# Patient Record
Sex: Female | Born: 1985 | Race: White | Hispanic: No | Marital: Single | Smoking: Never smoker
Health system: Southern US, Community
[De-identification: ages and names within clinical notes are randomized; demographics above are authoritative.]

---

## 2005-01-22 ENCOUNTER — Ambulatory Visit: Payer: Self-pay | Admitting: Psychiatry

## 2005-01-22 ENCOUNTER — Inpatient Hospital Stay (HOSPITAL_COMMUNITY): Admission: EM | Admit: 2005-01-22 | Discharge: 2005-01-24 | Payer: Self-pay | Admitting: Psychiatry

## 2009-07-16 ENCOUNTER — Ambulatory Visit (HOSPITAL_COMMUNITY): Admission: RE | Admit: 2009-07-16 | Discharge: 2009-07-16 | Payer: Self-pay | Admitting: Pediatrics

## 2009-07-22 ENCOUNTER — Ambulatory Visit (HOSPITAL_COMMUNITY): Admission: RE | Admit: 2009-07-22 | Discharge: 2009-07-22 | Payer: Self-pay | Admitting: General Surgery

## 2009-08-04 ENCOUNTER — Encounter (HOSPITAL_COMMUNITY): Admission: RE | Admit: 2009-08-04 | Discharge: 2009-09-03 | Payer: Self-pay | Admitting: Oncology

## 2009-08-05 ENCOUNTER — Ambulatory Visit (HOSPITAL_COMMUNITY): Payer: Self-pay | Admitting: Oncology

## 2009-09-16 ENCOUNTER — Encounter (HOSPITAL_COMMUNITY): Admission: RE | Admit: 2009-09-16 | Discharge: 2009-10-16 | Payer: Self-pay | Admitting: Oncology

## 2009-10-05 ENCOUNTER — Ambulatory Visit (HOSPITAL_COMMUNITY): Payer: Self-pay | Admitting: Oncology

## 2010-04-07 ENCOUNTER — Encounter (HOSPITAL_COMMUNITY): Admission: RE | Admit: 2010-04-07 | Payer: Self-pay | Source: Home / Self Care | Admitting: Oncology

## 2010-04-07 ENCOUNTER — Ambulatory Visit (HOSPITAL_COMMUNITY): Payer: Self-pay | Admitting: Oncology

## 2010-06-28 LAB — IGG: IgG (Immunoglobin G), Serum: 1070 mg/dL (ref 694–1618)

## 2010-06-29 LAB — COMPREHENSIVE METABOLIC PANEL
ALT: 35 U/L (ref 0–35)
AST: 33 U/L (ref 0–37)
Alkaline Phosphatase: 82 U/L (ref 39–117)
BUN: 7 mg/dL (ref 6–23)
Chloride: 105 mEq/L (ref 96–112)
Glucose, Bld: 94 mg/dL (ref 70–99)
Sodium: 136 mEq/L (ref 135–145)
Total Protein: 6.5 g/dL (ref 6.0–8.3)

## 2010-06-29 LAB — DIFFERENTIAL
Basophils Relative: 0 % (ref 0–1)
Eosinophils Absolute: 0.2 10*3/uL (ref 0.0–0.7)
Lymphocytes Relative: 53 % — ABNORMAL HIGH (ref 12–46)
Monocytes Absolute: 0.3 10*3/uL (ref 0.1–1.0)
Monocytes Relative: 8 % (ref 3–12)

## 2010-06-29 LAB — CBC
HCT: 37 % (ref 36.0–46.0)
Hemoglobin: 13.1 g/dL (ref 12.0–15.0)
MCHC: 35.5 g/dL (ref 30.0–36.0)
RBC: 4.2 MIL/uL (ref 3.87–5.11)
WBC: 4 10*3/uL (ref 4.0–10.5)

## 2010-06-29 LAB — CYTOMEGALOVIRUS ANTIBODY, IGG: Cytomegalovirus(CMV) Antibody, IgG: NEGATIVE

## 2010-06-29 LAB — EPSTEIN-BARR VIRUS VCA ANTIBODY PANEL: EBV EA IgG: 0.68 {ISR}

## 2010-06-30 LAB — BASIC METABOLIC PANEL
CO2: 26 mEq/L (ref 19–32)
Creatinine, Ser: 0.73 mg/dL (ref 0.4–1.2)
Glucose, Bld: 99 mg/dL (ref 70–99)
Sodium: 138 mEq/L (ref 135–145)

## 2010-06-30 LAB — DIFFERENTIAL
Lymphocytes Relative: 43 % (ref 12–46)
Lymphs Abs: 1.7 10*3/uL (ref 0.7–4.0)
Monocytes Relative: 5 % (ref 3–12)
Neutro Abs: 1.9 10*3/uL (ref 1.7–7.7)
Neutrophils Relative %: 48 % (ref 43–77)

## 2010-06-30 LAB — CBC
HCT: 36 % (ref 36.0–46.0)
Hemoglobin: 12.6 g/dL (ref 12.0–15.0)
MCV: 88.9 fL (ref 78.0–100.0)
Platelets: 206 10*3/uL (ref 150–400)
RBC: 4.06 MIL/uL (ref 3.87–5.11)
RDW: 13 % (ref 11.5–15.5)

## 2010-08-27 NOTE — H&P (Signed)
NAME:  Angela Mathews, Angela Mathews             ACCOUNT NO.:  [id number]   MEDICAL RECORD NO.:  [id number]          PATIENT TYPE:  IPS   LOCATION:  0303                          FACILITY:  [facility]   PHYSICIAN:  Jeanice Lim, M.D. DATE OF BIRTH:  Oct 28, 1985   DATE OF ADMISSION:  01/22/2005  DATE OF DISCHARGE:                         PSYCHIATRIC ADMISSION ASSESSMENT   This is a voluntary admission to the services of Dr. Aleatha Borer.   IDENTIFYING INFORMATION:  This is a 25 year old single white female.  She  presented to the ED at [facility] last night reporting suicidal  ideation.  She did not have a distinct plan.  She reported that she has been  eating 15-18 1-mg Xanax that she buys off the street on a daily basis.  She has been having increasing depression since the breakup with her partner  of 2 years a few months ago.  Apparently at that time she made a suicidal  gesture by superficially cutting her left wrist.  It did not require  stitches.  Besides the Xanax every day, she has also been drinking a pint of  liquor daily or when drinking beer drinking at least a six-pack or sometimes  more.  She reports low self-esteem and that life is just not worth living.  She received a DWI last week and lost her chance of being in [facility] where she was in criminal justice.  She appeared depressed  with a flat affect.   PAST PSYCHIATRIC HISTORY:  She states that back in May and June she took  Lexapro which was prescribed by her primary care Stanley Lyness.   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  She is currently  enrolled at [facility] in criminal justice, but she states  she blew a 0.13 and got a DWI last week.  She does have a pending court date  on February 14, 2005.   FAMILY HISTORY:  Alcoholism on her mother's side, and her great grandfather  died from alcoholism.   ALCOHOL AND DRUG HISTORY:  As already noted.  She began using  Xanax in the  12th grade.  She has been increasing, her pattern which used to be 7-10 mg a  day.  She is now up to 16-18.  Alcohol and beer she began using at age 55.  She drinks a six-pack a day and has liquor 3 times a week, 16-ounce malt  liquor.   PRIMARY CARE Vaudine Dutan:  Dr. Neita Carp in [place].   MEDICAL PROBLEMS:  Morbid obesity.   MEDICATIONS:  She is not prescribed any.  She does however buy Xanax off the  street.  The blue 1-mg pills, she states.   ALLERGIES:  She has no known drug allergies.   PHYSICAL EXAMINATION:  She is 66 inches tall, weight 253 pounds, temperature  97.8, blood pressure 122/72 to 116/74, pulse 100-108, respirations 16.  She  has a total of 19 tattoos.  Other than that her physical exam was  unremarkable and is documented from the ER.   MENTAL STATUS EXAM:  She is alert and  oriented.  She has fair eye contact.  Her motor behavior is normal.  Her speech is normal.  She is alert.  She is  tearful and irritable.  She is missing the Xanax.  Her affect is  appropriate.  Her anxiety level is moderate.  Her thought processes are  coherent.  Her judgment is fair.  Her orientation is full.  Memory is  intact.  Her IQ is at least average.  Her insight was good.  Impulse control  was fair.  Appetite was fair.  Her need for sleep has been increased.  Yesterday she states she in fact felt suicidal and homicidal.  She was  afraid of what might happen, hence, she went to the emergency room.  She  denies ever having auditory or visual hallucinations.   ADMISSION DIAGNOSIS:  AXIS I:  Major depressive disorder.  Polysubstance  dependence.  AXIS II:  Deferred.  AXIS III:  Morbid obesity.  AXIS IV:  Family and relationship issues.  AXIS V:  35.   PLAN:  Admit for safety and stabilization.  Help support her though her  benzo withdrawal and alcohol withdrawal, although yesterday her alcohol  level was low.  Toward that end we will start the low dose Librium protocol  and  will also start her on some Wellbutrin.      Mickie Leonarda Salon, P.A.-C.      Jeanice Lim, M.D.  Electronically Signed    MD/MEDQ  D:  01/22/2005  T:  01/22/2005  Job:  [id number]

## 2010-08-27 NOTE — Discharge Summary (Signed)
NAME:  Angela Mathews, [place]             ACCOUNT NO.:  [id number]   MEDICAL RECORD NO.:  [id number]          PATIENT TYPE:  IPS   LOCATION:  0303                          FACILITY:  [facility]   PHYSICIAN:  Jeanice Lim, M.D. DATE OF BIRTH:  06-22-1985   DATE OF ADMISSION:  01/22/2005  DATE OF DISCHARGE:  01/24/2005                                 DISCHARGE SUMMARY   IDENTIFYING DATA:  This is a 25 year old single Caucasian female afraid she  would hurt herself of others.  She went to the emergency room.  In a same-  sex relationship for 2 years and broke a few months ago with increased  depression.  She buys Xanax from people off the street.  Taking up to 15-18  mg a day.   PAST PSYCHIATRIC HISTORY:  She denies past psychiatric history.  Took  Lexapro May through June.   MEDICATIONS:  Xanax, bought illegally off the street, taking 15-18 mg on  average.   ALLERGIES:  No known drug allergies.   PHYSICAL EXAMINATION:  Physical and neurologic exams essentially within  normal limits except for morbid obesity, and slight elevation in glucose as  far as routine admission labs.   MENTAL STATUS EXAM:  Alert and oriented x 3.  Dressed casually.  No  psychomotor abnormalities.  Speech within normal limits.  Mood depressed.  Affect restricted but anxious and tremulous.  Thought processes goal  directed.  Cognitively intact.  Judgment and insight were impaired.   ADMISSION DIAGNOSES:  AXIS I:  Benzodiazepine dependence.  Polysubstance  abuse.  Major depressive disorder, recurrent, moderate.  Rule out substance-  induced mood disorder.  Partial withdrawal from benzodiazepines, withdrawal  syndrome.  AXIS II:  Dependent personality traits.  AXIS III:  Morbid obesity.  AXIS IV:  Severe stressors with primary support group, social environment,  education, occupation, housing, economic and problems related to the legal  system.  AXIS V:  35/65.   HOSPITAL COURSE:  Patient was admitted, ordered  routine p.r.n. medications,  underwent further monitoring, was encouraged to participate in individual,  group and milieu therapy.  Patient was started on Librium, and Wellbutrin  was held initially to be started after patient was initially detoxed.  Patient was then monitored closely.  She had a DWI a few weeks ago.  She was  agreeable to intensive outpatient treatment.  Described a history of mood  swings with severe anxiety and stabilized on Depakote, Risperdal and  medication to restore sleep.  Patient was discharged with stable mood, no  dangerous ideation, no psychotic symptoms, no acute withdrawal symptoms,  agreeable to IOP.   DISCHARGE MEDICATIONS:  She was given medication education and discharged  on:  1.  Librium 25 mg 1 in the morning and 1 at 6:00 p.m. for 1 week and then 1      at 6:00 p.m. for 1 week and then stop due to her excessively high      prolonged Xanax use risk of prolonged withdrawal and serious      complications and indication to use a prolonged Librium taper.  2.  Wellbutrin  XL 150 mg q.a.m.  3.  Risperdal 0.5 mg at 8:00 p.m.  4.  Ambien 10 mg q.h.s. p.r.n.  5.  Depakote 500 mg at 8:00 p.m.   FOLLOW UP:  Dr. Ellery Plunk in [facility] on Friday, January 28, 2005 at 5:00 p.m.   DISCHARGE DIAGNOSES:  AXIS I:  Benzodiazepine dependence.  Polysubstance  abuse.  Major depressive disorder, recurrent, moderate.  Rule out substance-  induced mood disorder.  Partial withdrawal from benzodiazepines, withdrawal  syndrome.  AXIS II:  Dependent personality traits.  AXIS III:  Morbid obesity.  AXIS IV:  Severe stressors with primary support group, social environment,  education, occupation, housing, economic and problems related to the legal  system.  AXIS V:  Global assessment of function on discharge 55.      Jeanice Lim, M.D.  Electronically Signed     JEM/MEDQ  D:  02/15/2005  T:  02/16/2005  Job:  [id number]

## 2017-10-30 ENCOUNTER — Encounter (HOSPITAL_COMMUNITY): Payer: Self-pay

## 2017-10-30 ENCOUNTER — Other Ambulatory Visit: Payer: Self-pay

## 2017-10-30 ENCOUNTER — Emergency Department (HOSPITAL_COMMUNITY)
Admission: EM | Admit: 2017-10-30 | Discharge: 2017-10-30 | Disposition: A | Discharge status: Home / Self Care | Payer: Self-pay | Attending: Emergency Medicine | Admitting: Emergency Medicine

## 2017-10-30 ENCOUNTER — Emergency Department (HOSPITAL_COMMUNITY): Payer: Self-pay

## 2017-10-30 DIAGNOSIS — E876 Hypokalemia: Secondary | ICD-10-CM | POA: Insufficient documentation

## 2017-10-30 DIAGNOSIS — N201 Calculus of ureter: Secondary | ICD-10-CM | POA: Insufficient documentation

## 2017-10-30 LAB — COMPREHENSIVE METABOLIC PANEL
ALT: 14 U/L (ref 0–44)
AST: 16 U/L (ref 15–41)
Albumin: 3.9 g/dL (ref 3.5–5.0)
Alkaline Phosphatase: 56 U/L (ref 38–126)
Anion gap: 8 (ref 5–15)
BUN: 8 mg/dL (ref 6–20)
CHLORIDE: 106 mmol/L (ref 98–111)
CO2: 26 mmol/L (ref 22–32)
CREATININE: 0.75 mg/dL (ref 0.44–1.00)
Calcium: 9.4 mg/dL (ref 8.9–10.3)
GFR calc non Af Amer: >60 mL/min (ref >60)
Glucose, Bld: 122 mg/dL — ABNORMAL HIGH (ref 70–99)
POTASSIUM: 2.9 mmol/L — AB (ref 3.5–5.1)
SODIUM: 140 mmol/L (ref 135–145)
Total Bilirubin: 0.7 mg/dL (ref 0.3–1.2)
Total Protein: 7.1 g/dL (ref 6.5–8.1)

## 2017-10-30 LAB — CBC WITH DIFFERENTIAL/PLATELET
BASOS ABS: 0 10*3/uL (ref 0.0–0.1)
Basophils Relative: 1 %
EOS PCT: 1 %
Eosinophils Absolute: 0.1 10*3/uL (ref 0.0–0.7)
HEMATOCRIT: 38.8 % (ref 36.0–46.0)
HEMOGLOBIN: 12.6 g/dL (ref 12.0–15.0)
LYMPHS ABS: 1.5 10*3/uL (ref 0.7–4.0)
LYMPHS PCT: 30 %
MCH: 30 pg (ref 26.0–34.0)
MCHC: 32.5 g/dL (ref 30.0–36.0)
MCV: 92.4 fL (ref 78.0–100.0)
Monocytes Absolute: 0.5 10*3/uL (ref 0.1–1.0)
Monocytes Relative: 9 %
NEUTROS ABS: 3 10*3/uL (ref 1.7–7.7)
NEUTROS PCT: 59 %
Platelets: 284 10*3/uL (ref 150–400)
RBC: 4.2 MIL/uL (ref 3.87–5.11)
RDW: 13.4 % (ref 11.5–15.5)
WBC: 5.1 10*3/uL (ref 4.0–10.5)

## 2017-10-30 LAB — URINALYSIS, ROUTINE W REFLEX MICROSCOPIC
Glucose, UA: NEGATIVE mg/dL
LEUKOCYTES UA: NEGATIVE
Nitrite: NEGATIVE
PH: 6 (ref 5.0–8.0)
Protein, ur: 30 mg/dL — AB

## 2017-10-30 LAB — URINALYSIS, MICROSCOPIC (REFLEX)
RBC / HPF: >50 RBC/hpf (ref 0–5)
WBC, UA: NONE SEEN WBC/hpf (ref 0–5)

## 2017-10-30 LAB — LIPASE, BLOOD: Lipase: 27 U/L (ref 11–51)

## 2017-10-30 LAB — I-STAT BETA HCG BLOOD, ED (MC, WL, AP ONLY): I-stat hCG, quantitative: <5 m[IU]/mL (ref <5)

## 2017-10-30 MED ORDER — ONDANSETRON HCL 4 MG/2ML IJ SOLN
4.0000 mg | Freq: Once | INTRAMUSCULAR | Status: AC
  Administered 2017-10-30: 4 mg via INTRAVENOUS
  Filled 2017-10-30: qty 2

## 2017-10-30 MED ORDER — POTASSIUM CHLORIDE CRYS ER 20 MEQ PO TBCR
40.0000 meq | EXTENDED_RELEASE_TABLET | Freq: Once | ORAL | Status: AC
  Administered 2017-10-30: 40 meq via ORAL
  Filled 2017-10-30: qty 2

## 2017-10-30 MED ORDER — HYDROCODONE-ACETAMINOPHEN 5-325 MG PO TABS: 1.0000 | ORAL_TABLET | ORAL | 0 refills | Status: DC | PRN

## 2017-10-30 MED ORDER — HYDROMORPHONE HCL 1 MG/ML IJ SOLN
1.0000 mg | Freq: Once | INTRAMUSCULAR | Status: AC
  Administered 2017-10-30: 1 mg via INTRAVENOUS
  Filled 2017-10-30: qty 1

## 2017-10-30 MED ORDER — ONDANSETRON 4 MG PO TBDP: 4.0000 mg | ORAL_TABLET | Freq: Three times a day (TID) | ORAL | 0 refills | Status: DC | PRN

## 2017-10-30 MED ORDER — SODIUM CHLORIDE 0.9 % IV BOLUS
1000.0000 mL | Freq: Once | INTRAVENOUS | Status: AC
  Administered 2017-10-30: 1000 mL via INTRAVENOUS

## 2017-10-30 MED ORDER — FENTANYL CITRATE (PF) 100 MCG/2ML IJ SOLN
100.0000 ug | Freq: Once | INTRAMUSCULAR | Status: AC
  Administered 2017-10-30: 100 ug via INTRAVENOUS
  Filled 2017-10-30: qty 2

## 2017-10-30 MED ORDER — POTASSIUM CHLORIDE CRYS ER 20 MEQ PO TBCR: 20.0000 meq | EXTENDED_RELEASE_TABLET | Freq: Two times a day (BID) | ORAL | 0 refills | Status: DC

## 2017-10-30 MED ORDER — NAPROXEN 375 MG PO TABS: 375.0000 mg | ORAL_TABLET | Freq: Two times a day (BID) | ORAL | 0 refills | Status: DC

## 2017-10-30 MED ORDER — HYDROCODONE-ACETAMINOPHEN 5-325 MG PO TABS
1.0000 | ORAL_TABLET | Freq: Once | ORAL | Status: AC
  Administered 2017-10-30: 1 via ORAL
  Filled 2017-10-30: qty 1

## 2017-10-30 NOTE — ED Provider Notes (Signed)
[facility] EMERGENCY DEPARTMENT Provider Note   CSN: [id number] Arrival date & time: 10/30/17  8295     History   Chief Complaint Chief Complaint  Patient presents with  . Flank Pain    right    HPI Angela Mathews is a 32 y.o. female.  HPI  32 year old female presents with severe back and abdominal pain.  Started 2 hours ago and woke her up from sleep.  Never had similar symptoms.  She is laying on her right side as this helps ease the pain a little bit.  No prior history of kidney stones.  EMS gave her 30 mg IV Toradol and 4 mg Zofran.  Patient states the pain has not relieved at all.  It is severe.  Hard to describe but just feels horrible.  Has had nausea and has vomited once.  She feels an urgency to urinate but only a couple drops came out when she tried and there was a little bit of blood.  No fevers.  History reviewed. No pertinent past medical history.  There are no active problems to display for this patient.   History reviewed. No pertinent surgical history.   OB History   None      Home Medications    Prior to Admission medications   Medication Sig Start Date End Date Taking? Authorizing Provider  HYDROcodone-acetaminophen (NORCO) 5-325 MG tablet Take 1-2 tablets by mouth every 4 (four) hours as needed for severe pain. 10/30/17   Pricilla Loveless, MD  naproxen (NAPROSYN) 375 MG tablet Take 1 tablet (375 mg total) by mouth 2 (two) times daily with a meal. 10/30/17   Pricilla Loveless, MD  ondansetron (ZOFRAN ODT) 4 MG disintegrating tablet Take 1 tablet (4 mg total) by mouth every 8 (eight) hours as needed. 4mg  ODT q4 hours prn nausea/vomit 10/30/17   Pricilla Loveless, MD  potassium chloride SA (K-DUR,KLOR-CON) 20 MEQ tablet Take 1 tablet (20 mEq total) by mouth 2 (two) times daily for 3 days. 10/30/17 11/02/17  Pricilla Loveless, MD    Family History History reviewed. No pertinent family history.  Social History Social History   Tobacco Use  . Smoking status:  Never Smoker  . Smokeless tobacco: Never Used  Substance Use Topics  . Alcohol use: Never    Frequency: Never  . Drug use: Yes    Frequency: 2.0 times per week    Types: IV    Comment: last used heroin yesterday 7/21     Allergies   Augmentin [amoxicillin-pot clavulanate]   Review of Systems Review of Systems  Constitutional: Negative for fever.  Gastrointestinal: Positive for abdominal pain, nausea and vomiting.  Genitourinary: Positive for hematuria. Negative for dysuria, vaginal bleeding and vaginal discharge.  Musculoskeletal: Positive for back pain.  All other systems reviewed and are negative.    Physical Exam Updated Vital Signs BP 123/87 (BP Location: Left Arm)   Pulse 77   Temp 98.5 F (36.9 C) (Oral)   Resp 16   Ht 5\' 5"  (1.651 m)   Wt 99.8 kg (220 lb)   LMP 10/16/2017 (Approximate)   SpO2 100%   BMI 36.61 kg/m   Physical Exam  Constitutional: She is oriented to person, place, and time. She appears well-developed and well-nourished. She appears distressed (in pain).  Laying on right side  HENT:  Head: Normocephalic and atraumatic.  Right Ear: External ear normal.  Left Ear: External ear normal.  Nose: Nose normal.  Eyes: Right eye exhibits no discharge. Left  eye exhibits no discharge.  Cardiovascular: Normal rate, regular rhythm and normal heart sounds.  Pulmonary/Chest: Effort normal and breath sounds normal.  Abdominal: Soft. There is tenderness (worst in RLQ) in the right upper quadrant and right lower quadrant. There is CVA tenderness (right).  Musculoskeletal:       Thoracic back: She exhibits tenderness. She exhibits no bony tenderness.       Lumbar back: She exhibits tenderness. She exhibits no bony tenderness.       Back:  Neurological: She is alert and oriented to person, place, and time.  Skin: Skin is warm and dry. She is not diaphoretic.  Nursing note and vitals reviewed.    ED Treatments / Results  Labs (all labs ordered are  listed, but only abnormal results are displayed) Labs Reviewed  COMPREHENSIVE METABOLIC PANEL - Abnormal; Notable for the following components:      Result Value   Potassium 2.9 (*)    Glucose, Bld 122 (*)    All other components within normal limits  URINALYSIS, ROUTINE W REFLEX MICROSCOPIC - Abnormal; Notable for the following components:   APPearance CLOUDY (*)    Specific Gravity, Urine >1.030 (*)    Hgb urine dipstick MODERATE (*)    Bilirubin Urine MODERATE (*)    Ketones, ur TRACE (*)    Protein, ur 30 (*)    All other components within normal limits  URINALYSIS, MICROSCOPIC (REFLEX) - Abnormal; Notable for the following components:   Bacteria, UA MANY (*)    All other components within normal limits  URINE CULTURE  LIPASE, BLOOD  CBC WITH DIFFERENTIAL/PLATELET  I-STAT BETA HCG BLOOD, ED ([facility], [facility], [facility] ONLY)    EKG None  Radiology Ct Renal Stone Study  Result Date: 10/30/2017 CLINICAL DATA:  32 year old female with right flank pain starting a couple hours ago. Initial encounter. EXAM: CT ABDOMEN AND PELVIS WITHOUT CONTRAST TECHNIQUE: Multidetector CT imaging of the abdomen and pelvis was performed following the standard protocol without IV contrast. COMPARISON:  09/16/2009 CT. FINDINGS: Lower chest: Minimal basilar atelectasis/scarring. Heart size within normal limits. Hepatobiliary: Adjacent to the falciform ligament within the medial segment of the left lobe of the liver 2 cm low-density area may represent focal fatty infiltration although cannot exclude primary liver lesion such as hemangioma. This can be assessed with ultrasound or dedicated liver MR. Probable gallbladder sludge without calcified gallstone or common bile duct stone. Pancreas: Taking into account limitation by non contrast imaging, no worrisome pancreatic mass or inflammation. Spleen: Taking into account limitation by non contrast imaging, no splenic mass or enlargement. Adrenals/Urinary Tract: Mild to moderate  right hydroureteronephrosis secondary to 2.4 mm right ureteral vesicle junction/distal right ureteral obstructing stone. Several small nonobstructing right renal calculi and left upper pole renal calculus. Taking into account limitation by non contrast imaging, no worrisome renal or adrenal lesion. Contracted urinary bladder. Stomach/Bowel: Appendix poorly visualized although no right lower quadrant bowel inflammatory process is noted. Portion of stomach, small bowel and colon under distended and evaluation limited. Vascular/Lymphatic: No abdominal aortic aneurysm. Scattered small lymph nodes without adenopathy. Reproductive: No worrisome adnexal or uterine abnormality. Other: 6 no free air or bowel containing hernia. Musculoskeletal: No worrisome osseous abnormality. IMPRESSION: Mild to moderate right hydroureteronephrosis secondary to 2.4 mm right ureteral vesicle junction/distal right ureteral obstructing stone. Several small nonobstructing right renal calculi and left upper pole renal calculus. Appendix poorly visualized although no right lower quadrant bowel inflammatory process is noted. Adjacent to the falciform ligament within the  medial segment of the left lobe of the liver 2 cm low-density area may represent focal fatty infiltration although cannot exclude primary liver lesion such as hemangioma. Finding new since prior CT. This can be assessed with ultrasound or dedicated liver MR. Electronically Signed   By: Lacy Duverney M.D.   On: 10/30/2017 08:44    Procedures Procedures (including critical care time)  Medications Ordered in ED Medications  HYDROcodone-acetaminophen (NORCO/VICODIN) 5-325 MG per tablet 1 tablet (has no administration in time range)  sodium chloride 0.9 % bolus 1,000 mL (0 mLs Intravenous Stopped 10/30/17 0928)  HYDROmorphone (DILAUDID) injection 1 mg (1 mg Intravenous Given 10/30/17 0737)  fentaNYL (SUBLIMAZE) injection 100 mcg (100 mcg Intravenous Given 10/30/17 0929)    ondansetron (ZOFRAN) injection 4 mg (4 mg Intravenous Given 10/30/17 0934)  potassium chloride SA (K-DUR,KLOR-CON) CR tablet 40 mEq (40 mEq Oral Given 10/30/17 0934)     Initial Impression / Assessment and Plan / ED Course  I have reviewed the triage vital signs and the nursing notes.  Pertinent labs & imaging results that were available during my care of the patient were reviewed by me and considered in my medical decision making (see chart for details).     Patient CT scan confirms right ureteral stone.  The urine shows many bacteria but no white blood cells or nitrites and so I think this is asymptomatic bacteriuria.  She is also noted to be hypokalemic and has taken potassium here and will be discharged with potassium.  While she still having pain, she is feeling better and I think is reasonable to discharge her home with pain control.  She was instructed on return precautions and instructed to follow-up with urology.  Final Clinical Impressions(s) / ED Diagnoses   Final diagnoses:  Right ureteral stone  Hypokalemia    ED Discharge Orders        Ordered    HYDROcodone-acetaminophen (NORCO) 5-325 MG tablet  Every 4 hours PRN     10/30/17 1031    ondansetron (ZOFRAN ODT) 4 MG disintegrating tablet  Every 8 hours PRN     10/30/17 1031    potassium chloride SA (K-DUR,KLOR-CON) 20 MEQ tablet  2 times daily     10/30/17 1031    naproxen (NAPROSYN) 375 MG tablet  2 times daily with meals     10/30/17 1031       Pricilla Loveless, MD 10/30/17 1037

## 2017-10-30 NOTE — Discharge Instructions (Signed)
If your pain worsens, you develop recurrent vomiting, or you develop fevers or signs or symptoms of a urinary tract infection then return to the ER for evaluation.  Otherwise follow-up with the urologist as scheduled.

## 2017-10-30 NOTE — ED Notes (Signed)
Given strain to strain urine at home. +

## 2017-10-30 NOTE — ED Triage Notes (Signed)
Pt started having right sided flank pain that started a couple hours ago. It radiates to the front.  Denies history of kidney stones EMS gave 30 mg  toradol and 4mg   zofran

## 2017-10-31 LAB — URINE CULTURE

## 2021-07-28 ENCOUNTER — Inpatient Hospital Stay (HOSPITAL_COMMUNITY): Payer: Self-pay

## 2021-07-28 ENCOUNTER — Inpatient Hospital Stay (HOSPITAL_COMMUNITY)
Admission: AD | Admit: 2021-07-28 | Discharge: 2021-08-01 | DRG: 917 | Disposition: A | Discharge status: Home / Self Care | Payer: Self-pay | Source: Other Acute Inpatient Hospital | Attending: Internal Medicine | Admitting: Internal Medicine

## 2021-07-28 DIAGNOSIS — G928 Other toxic encephalopathy: Secondary | ICD-10-CM | POA: Diagnosis present

## 2021-07-28 DIAGNOSIS — Z7189 Other specified counseling: Secondary | ICD-10-CM

## 2021-07-28 DIAGNOSIS — T43621A Poisoning by amphetamines, accidental (unintentional), initial encounter: Secondary | ICD-10-CM | POA: Diagnosis present

## 2021-07-28 DIAGNOSIS — J690 Pneumonitis due to inhalation of food and vomit: Secondary | ICD-10-CM | POA: Diagnosis present

## 2021-07-28 DIAGNOSIS — F1510 Other stimulant abuse, uncomplicated: Secondary | ICD-10-CM | POA: Diagnosis present

## 2021-07-28 DIAGNOSIS — Z781 Physical restraint status: Secondary | ICD-10-CM

## 2021-07-28 DIAGNOSIS — F1110 Opioid abuse, uncomplicated: Secondary | ICD-10-CM | POA: Diagnosis present

## 2021-07-28 DIAGNOSIS — T424X1A Poisoning by benzodiazepines, accidental (unintentional), initial encounter: Secondary | ICD-10-CM | POA: Diagnosis present

## 2021-07-28 DIAGNOSIS — T402X1A Poisoning by other opioids, accidental (unintentional), initial encounter: Principal | ICD-10-CM | POA: Diagnosis present

## 2021-07-28 DIAGNOSIS — E876 Hypokalemia: Secondary | ICD-10-CM | POA: Diagnosis present

## 2021-07-28 DIAGNOSIS — G9340 Encephalopathy, unspecified: Secondary | ICD-10-CM

## 2021-07-28 DIAGNOSIS — R531 Weakness: Secondary | ICD-10-CM | POA: Diagnosis present

## 2021-07-28 DIAGNOSIS — A419 Sepsis, unspecified organism: Secondary | ICD-10-CM | POA: Diagnosis present

## 2021-07-28 DIAGNOSIS — T50904A Poisoning by unspecified drugs, medicaments and biological substances, undetermined, initial encounter: Secondary | ICD-10-CM

## 2021-07-28 DIAGNOSIS — F1310 Sedative, hypnotic or anxiolytic abuse, uncomplicated: Secondary | ICD-10-CM | POA: Diagnosis present

## 2021-07-28 DIAGNOSIS — D649 Anemia, unspecified: Secondary | ICD-10-CM | POA: Diagnosis present

## 2021-07-28 DIAGNOSIS — R7989 Other specified abnormal findings of blood chemistry: Secondary | ICD-10-CM | POA: Diagnosis present

## 2021-07-28 DIAGNOSIS — J9600 Acute respiratory failure, unspecified whether with hypoxia or hypercapnia: Secondary | ICD-10-CM

## 2021-07-28 DIAGNOSIS — Z79899 Other long term (current) drug therapy: Secondary | ICD-10-CM

## 2021-07-28 DIAGNOSIS — T50901A Poisoning by unspecified drugs, medicaments and biological substances, accidental (unintentional), initial encounter: Principal | ICD-10-CM | POA: Diagnosis present

## 2021-07-28 DIAGNOSIS — Z880 Allergy status to penicillin: Secondary | ICD-10-CM

## 2021-07-28 DIAGNOSIS — Z6841 Body Mass Index (BMI) 40.0 and over, adult: Secondary | ICD-10-CM

## 2021-07-28 DIAGNOSIS — J9601 Acute respiratory failure with hypoxia: Secondary | ICD-10-CM | POA: Diagnosis present

## 2021-07-28 DIAGNOSIS — I472 Ventricular tachycardia, unspecified: Secondary | ICD-10-CM | POA: Diagnosis not present

## 2021-07-28 DIAGNOSIS — B369 Superficial mycosis, unspecified: Secondary | ICD-10-CM | POA: Diagnosis present

## 2021-07-28 DIAGNOSIS — R9089 Other abnormal findings on diagnostic imaging of central nervous system: Secondary | ICD-10-CM | POA: Diagnosis present

## 2021-07-28 DIAGNOSIS — Z791 Long term (current) use of non-steroidal anti-inflammatories (NSAID): Secondary | ICD-10-CM

## 2021-07-28 DIAGNOSIS — J189 Pneumonia, unspecified organism: Secondary | ICD-10-CM | POA: Diagnosis present

## 2021-07-28 LAB — BASIC METABOLIC PANEL
Anion gap: 6 (ref 5–15)
Anion gap: 6 (ref 5–15)
BUN: 7 mg/dL (ref 6–20)
BUN: 7 mg/dL (ref 6–20)
CO2: 20 mmol/L — ABNORMAL LOW (ref 22–32)
CO2: 21 mmol/L — ABNORMAL LOW (ref 22–32)
Calcium: 7.4 mg/dL — ABNORMAL LOW (ref 8.9–10.3)
Calcium: 8.1 mg/dL — ABNORMAL LOW (ref 8.9–10.3)
Chloride: 118 mmol/L — ABNORMAL HIGH (ref 98–111)
Chloride: 114 mmol/L — ABNORMAL HIGH (ref 98–111)
Creatinine, Ser: 0.5 mg/dL (ref 0.44–1.00)
Creatinine, Ser: 0.6 mg/dL (ref 0.44–1.00)
GFR, Estimated: >60 mL/min (ref >60)
GFR, Estimated: >60 mL/min (ref >60)
Glucose, Bld: 94 mg/dL (ref 70–99)
Glucose, Bld: 131 mg/dL — ABNORMAL HIGH (ref 70–99)
Potassium: 3.2 mmol/L — ABNORMAL LOW (ref 3.5–5.1)
Potassium: 4.4 mmol/L (ref 3.5–5.1)
Sodium: 144 mmol/L (ref 135–145)
Sodium: 141 mmol/L (ref 135–145)

## 2021-07-28 LAB — CBC
HCT: 29 % — ABNORMAL LOW (ref 36.0–46.0)
Hemoglobin: 9.6 g/dL — ABNORMAL LOW (ref 12.0–15.0)
MCH: 31.2 pg (ref 26.0–34.0)
MCHC: 33.1 g/dL (ref 30.0–36.0)
MCV: 94.2 fL (ref 80.0–100.0)
Platelets: 174 10*3/uL (ref 150–400)
RBC: 3.08 MIL/uL — ABNORMAL LOW (ref 3.87–5.11)
RDW: 14.7 % (ref 11.5–15.5)
WBC: 5.8 10*3/uL (ref 4.0–10.5)
nRBC: 0 % (ref 0.0–0.2)

## 2021-07-28 LAB — POCT I-STAT 7, (LYTES, BLD GAS, ICA,H+H)
Acid-Base Excess: 0 mmol/L (ref 0.0–2.0)
Bicarbonate: 23.8 mmol/L (ref 20.0–28.0)
Calcium, Ion: 1.25 mmol/L (ref 1.15–1.40)
HCT: 25 % — ABNORMAL LOW (ref 36.0–46.0)
Hemoglobin: 8.5 g/dL — ABNORMAL LOW (ref 12.0–15.0)
O2 Saturation: 100 %
Patient temperature: 98
Potassium: 3.7 mmol/L (ref 3.5–5.1)
Sodium: 144 mmol/L (ref 135–145)
TCO2: 25 mmol/L (ref 22–32)
pCO2 arterial: 34.3 mmHg (ref 32–48)
pH, Arterial: 7.447 (ref 7.35–7.45)
pO2, Arterial: 237 mmHg — ABNORMAL HIGH (ref 83–108)

## 2021-07-28 LAB — MAGNESIUM: Magnesium: 1.5 mg/dL — ABNORMAL LOW (ref 1.7–2.4)

## 2021-07-28 LAB — GLUCOSE, CAPILLARY
Glucose-Capillary: 86 mg/dL (ref 70–99)
Glucose-Capillary: 125 mg/dL — ABNORMAL HIGH (ref 70–99)

## 2021-07-28 LAB — PROCALCITONIN: Procalcitonin: <0.1 ng/mL

## 2021-07-28 LAB — MRSA NEXT GEN BY PCR, NASAL: MRSA by PCR Next Gen: NOT DETECTED

## 2021-07-28 LAB — PHOSPHORUS: Phosphorus: 1.8 mg/dL — ABNORMAL LOW (ref 2.5–4.6)

## 2021-07-28 LAB — HIV ANTIBODY (ROUTINE TESTING W REFLEX): HIV Screen 4th Generation wRfx: NONREACTIVE

## 2021-07-28 LAB — VANCOMYCIN, RANDOM: Vancomycin Rm: 11

## 2021-07-28 MED ORDER — DOCUSATE SODIUM 100 MG PO CAPS: 100.0000 mg | ORAL_CAPSULE | Freq: Two times a day (BID) | ORAL | Status: DC | PRN

## 2021-07-28 MED ORDER — VANCOMYCIN HCL IN DEXTROSE 1-5 GM/200ML-% IV SOLN
1000.0000 mg | Freq: Two times a day (BID) | INTRAVENOUS | Status: DC
Start: 2021-07-28 — End: 2021-07-29
  Administered 2021-07-28 – 2021-07-29 (×2): 1000 mg via INTRAVENOUS
  Filled 2021-07-28 (×2): qty 200

## 2021-07-28 MED ORDER — PANTOPRAZOLE 2 MG/ML SUSPENSION
40.0000 mg | Freq: Every day | ORAL | Status: DC
  Administered 2021-07-28 – 2021-07-29 (×2): 40 mg
  Filled 2021-07-28 (×2): qty 20

## 2021-07-28 MED ORDER — CHLORHEXIDINE GLUCONATE 0.12% ORAL RINSE (MEDLINE KIT)
15.0000 mL | Freq: Two times a day (BID) | OROMUCOSAL | Status: DC
  Administered 2021-07-28 – 2021-07-30 (×5): 15 mL via OROMUCOSAL

## 2021-07-28 MED ORDER — THIAMINE HCL 100 MG/ML IJ SOLN
250.0000 mg | Freq: Every day | INTRAVENOUS | Status: DC
  Filled 2021-07-28: qty 2.5

## 2021-07-28 MED ORDER — DEXMEDETOMIDINE HCL IN NACL 400 MCG/100ML IV SOLN
0.0000 ug/kg/h | INTRAVENOUS | Status: DC
  Administered 2021-07-28: 0.4 ug/kg/h via INTRAVENOUS
  Administered 2021-07-28: 0.9 ug/kg/h via INTRAVENOUS
  Administered 2021-07-29: 0.8 ug/kg/h via INTRAVENOUS
  Filled 2021-07-28 (×5): qty 100

## 2021-07-28 MED ORDER — POTASSIUM CHLORIDE 20 MEQ PO PACK
40.0000 meq | PACK | Freq: Once | ORAL | Status: AC
  Administered 2021-07-28: 40 meq
  Filled 2021-07-28: qty 2

## 2021-07-28 MED ORDER — POTASSIUM CHLORIDE 10 MEQ/50ML IV SOLN
10.0000 meq | INTRAVENOUS | Status: AC
  Administered 2021-07-28 (×3): 10 meq via INTRAVENOUS
  Filled 2021-07-28 (×4): qty 50

## 2021-07-28 MED ORDER — THIAMINE HCL 100 MG/ML IJ SOLN
250.0000 mg | Freq: Two times a day (BID) | INTRAVENOUS | Status: AC
  Administered 2021-07-28 – 2021-07-30 (×4): 250 mg via INTRAVENOUS
  Filled 2021-07-28 (×4): qty 2.5

## 2021-07-28 MED ORDER — SODIUM CHLORIDE 0.9 % IV SOLN
INTRAVENOUS | Status: DC | PRN
Start: 2021-07-28 — End: 2021-08-01

## 2021-07-28 MED ORDER — ADULT MULTIVITAMIN W/MINERALS CH
1.0000 | ORAL_TABLET | Freq: Every day | ORAL | Status: DC
  Administered 2021-07-28 – 2021-07-29 (×2): 1
  Filled 2021-07-28 (×2): qty 1

## 2021-07-28 MED ORDER — GADOBUTROL 1 MMOL/ML IV SOLN
10.0000 mL | Freq: Once | INTRAVENOUS | Status: AC | PRN
  Administered 2021-07-28: 10 mL via INTRAVENOUS

## 2021-07-28 MED ORDER — THIAMINE HCL 100 MG/ML IJ SOLN: 100.0000 mg | Freq: Every day | INTRAMUSCULAR | Status: DC

## 2021-07-28 MED ORDER — SODIUM CHLORIDE 0.9 % IV SOLN
1.0000 g | Freq: Three times a day (TID) | INTRAVENOUS | Status: DC
  Administered 2021-07-28 – 2021-07-30 (×6): 1 g via INTRAVENOUS
  Filled 2021-07-28 (×6): qty 20

## 2021-07-28 MED ORDER — CHLORHEXIDINE GLUCONATE CLOTH 2 % EX PADS: 6.0000 | MEDICATED_PAD | Freq: Every day | CUTANEOUS | Status: DC

## 2021-07-28 MED ORDER — POLYETHYLENE GLYCOL 3350 17 G PO PACK
17.0000 g | PACK | Freq: Every day | ORAL | Status: DC
  Administered 2021-07-28 – 2021-07-29 (×2): 17 g
  Filled 2021-07-28 (×2): qty 1

## 2021-07-28 MED ORDER — SODIUM CHLORIDE 0.9% FLUSH: 10.0000 mL | INTRAVENOUS | Status: DC | PRN

## 2021-07-28 MED ORDER — ORAL CARE MOUTH RINSE
15.0000 mL | OROMUCOSAL | Status: DC
  Administered 2021-07-28 – 2021-07-30 (×15): 15 mL via OROMUCOSAL

## 2021-07-28 MED ORDER — CHLORHEXIDINE GLUCONATE CLOTH 2 % EX PADS
6.0000 | MEDICATED_PAD | Freq: Every day | CUTANEOUS | Status: DC
Start: 2021-07-29 — End: 2021-08-01
  Administered 2021-07-28 – 2021-07-31 (×4): 6 via TOPICAL

## 2021-07-28 MED ORDER — PROSOURCE TF PO LIQD
45.0000 mL | Freq: Two times a day (BID) | ORAL | Status: DC
  Administered 2021-07-29: 45 mL
  Filled 2021-07-28 (×2): qty 45

## 2021-07-28 MED ORDER — ACETAMINOPHEN 325 MG PO TABS: 650.0000 mg | ORAL_TABLET | Freq: Four times a day (QID) | ORAL | Status: DC | PRN

## 2021-07-28 MED ORDER — POLYETHYLENE GLYCOL 3350 17 G PO PACK: 17.0000 g | PACK | Freq: Every day | ORAL | Status: DC | PRN

## 2021-07-28 MED ORDER — SODIUM CHLORIDE 0.9% FLUSH
10.0000 mL | Freq: Two times a day (BID) | INTRAVENOUS | Status: DC
  Administered 2021-07-28 – 2021-07-31 (×5): 10 mL

## 2021-07-28 MED ORDER — MAGNESIUM SULFATE 2 GM/50ML IV SOLN
2.0000 g | Freq: Once | INTRAVENOUS | Status: AC
  Administered 2021-07-28: 2 g via INTRAVENOUS
  Filled 2021-07-28: qty 50

## 2021-07-28 MED ORDER — THIAMINE HCL 100 MG/ML IJ SOLN
100.0000 mg | Freq: Every day | INTRAMUSCULAR | Status: DC
  Administered 2021-07-28: 100 mg via INTRAVENOUS
  Filled 2021-07-28: qty 2

## 2021-07-28 MED ORDER — DOCUSATE SODIUM 50 MG/5ML PO LIQD
100.0000 mg | Freq: Two times a day (BID) | ORAL | Status: DC
  Administered 2021-07-28 – 2021-07-29 (×2): 100 mg
  Filled 2021-07-28 (×3): qty 10

## 2021-07-28 MED ORDER — ONDANSETRON HCL 4 MG/2ML IJ SOLN
4.0000 mg | Freq: Once | INTRAMUSCULAR | Status: AC
  Administered 2021-07-28: 4 mg via INTRAVENOUS
  Filled 2021-07-28: qty 2

## 2021-07-28 MED ORDER — ENOXAPARIN SODIUM 60 MG/0.6ML IJ SOSY
0.5000 mg/kg | PREFILLED_SYRINGE | INTRAMUSCULAR | Status: DC
  Administered 2021-07-28 – 2021-07-30 (×3): 52.5 mg via SUBCUTANEOUS
  Filled 2021-07-28 (×3): qty 0.6

## 2021-07-28 MED ORDER — FOLIC ACID 1 MG PO TABS
1.0000 mg | ORAL_TABLET | Freq: Every day | ORAL | Status: DC
  Administered 2021-07-28 – 2021-07-29 (×2): 1 mg
  Filled 2021-07-28 (×2): qty 1

## 2021-07-28 MED ORDER — FENTANYL CITRATE (PF) 100 MCG/2ML IJ SOLN: 50.0000 ug | INTRAMUSCULAR | Status: DC | PRN

## 2021-07-28 MED ORDER — VITAL HIGH PROTEIN PO LIQD
1000.0000 mL | ORAL | Status: DC
  Administered 2021-07-28: 1000 mL

## 2021-07-28 MED ORDER — MAGNESIUM SULFATE 4 GM/100ML IV SOLN: 4.0000 g | Freq: Once | INTRAVENOUS | Status: DC

## 2021-07-28 MED ORDER — FENTANYL CITRATE (PF) 100 MCG/2ML IJ SOLN
50.0000 ug | INTRAMUSCULAR | Status: DC | PRN
  Administered 2021-07-28: 100 ug via INTRAVENOUS
  Filled 2021-07-28 (×4): qty 2

## 2021-07-28 NOTE — Plan of Care (Signed)
?  Interdisciplinary Goals of Care Family Meeting ? ? ?Date carried out:: 07/28/2021 ? ?Location of the meeting: Phone conference ? ?Member's involved: Family Member or next of kin and Other: PA-C ? ?Durable Power of Attorney or acting medical decision maker: Kesha Miska (Father)   ? ?Discussion: We discussed goals of care for [employer] .  Called Father Evarose Schutt and spoke over phone. Discussed patient's current status and that she just arrived to [facility] from [place] would like for [place] to continue current care for patient and remain full code at this.  ? ?Code status: Full Code ? ?Disposition: Continue current acute care ? ? ?Time spent for the meeting: 15 minutes ? ?Mick Sell ?07/28/2021, 3:50 PM ? ?

## 2021-07-28 NOTE — H&P (Signed)
? ?NAME:  Angela Mathews, MRN:  [id number], DOB:  02/01/1986, LOS: 0 ?ADMISSION DATE:  (Not on file), CONSULTATION DATE:  4/19 ?REFERRING MD:  Dr. Payton Emerald , CHIEF COMPLAINT:  b/l basal ganglia stroke  ? ?History of Present Illness:  ?Patient is a 36 yo F w/ pertinent PMH of polysubstance abuse presents to [facility] ED on 4/14 with AMS. ? ?EMS found patient on 4/14 with drugs and needles lying next to her. Pupils pinpoint. Patient was unresponsive with minimal response to Narcan. She did have some emesis. Patient tachypneic and was being bagged and transported to [facility] ED on 4/14. There patient was given another dose of IV Narcan with some response to painful stimuli. Patient was intubated for airway protection. UDS positive for amphetamines, opiates, and benzos. CXR showing multilobar bilateral bronchopneumoni concerning for aspiration pneumonia. BC 1/2 showing staph epidermis (likely contaminate). Started on Vanc and zosyn. Elevated LA. Sepsis protocol initiated and given IV fluids. CT head on 4/14 showed 0.9 mm hypodensity at the right lentiform nucleus, with additional possible 7 mm hypodensity at the contralateral left lentiform nucleus. Findings are age indeterminate, but could reflect a small acute ischemic infarcts. ? ?Patient WBC remained elevated and febrile; abx broadened to IV Azactam, Flagyl and vancomycin. UA/UC negative. On 4/17 repeat CT head done due to inability to wake up and it shows bilateral basal ganglia hypodensity which is somewhat larger than prior exam. Concern for evolving infarct or metabolic process.  ? ?On 4/18, remains intubated, had fever spike of 101.3 earlier this morning. Aztreonam changed to Meropenem. ? ?4/19 patient being transported to [facility] for further workup for possible b/l basal ganglia stroke. ? ?Pertinent  Medical History  ?Polysubstance abuse  ?Alcohol use ? ?Significant Hospital Events: ?Including procedures, antibiotic start and stop dates in addition to  other pertinent events   ?4/14: admitted to [facility] ED for OD; intubated; abx started for aspiration pneumonia. CT head: showed 0.9 mm hypodensity at the right lentiform nucleus, with additional possible 7 mm hypodensity at the contralateral left lentiform nucleus. Findings are age indeterminate, but could reflect a small acute ischemic infarcts. ?4/17 repeat CT head done due to inability to wake up and it shows bilateral basal ganglia hypodensity which is somewhat larger than prior exam. Concern for evolving infarct or metabolic process. Recommend MRI. ?4/19: transferring to [facility] for further workup for possible b/l basal ganglia stroke; remains intubated ? ?Interim History / Subjective:  ?Transported to ICU on Versed drip, intubated ?Given Versed on route ? ?Objective   ?There were no vitals taken for this visit. ?PAP: ()/()   ?   ?No intake or output data in the 24 hours ending 07/28/21 0953 ?There were no vitals filed for this visit. ? ?Examination: ? ?General:  critically ill appearing young woman on mech vent ?HEENT: MM pink/moist; ETT in place, no JVD ?Neuro: sedated; RASS -3 on Versed drip ?CV: s1s2, no m/r/g ?PULM: No accessory muscle use, bilateral ventilated breath sounds ?GI: soft, bsx4 active  ?Extremities: warm/dry, no edema  ?Skin: no rashes or lesions appreciated, multiple tattoos ? ? ?Resolved Hospital Problem list   ?Hyponatremia ? ?Assessment & Plan:  ? ?Acute B/l basal ganglia stroke: CT head on 4/14 showed 0.9 mm hypodensity at the right lentiform nucleus, with additional possible 7 mm hypodensity at the contralateral left lentiform nucleus. Findings are age indeterminate, but could reflect a small acute ischemic infarcts. 4/17 repeat CT head done due to inability to wake up and  it shows bilateral basal ganglia hypodensity which is somewhat larger than prior exam. Concern for evolving infarct or metabolic process. Recommended MRI. ?P: ?-neuro consult ?-MRI ?-frequent neuro  checks ?-neuroprotective measures: normothermia, normocapnea, normoglycemia, HOB >30 degrees, neck midline ? ? ?Acute Encephalopathy ?Drug overdose: UDS positive for opiates, amphetamines, and benzos ?Hx of polysubstance abuse ?Hx alcohol use: unknown amount used ?P: ?--Use Precedex and intermittent fentanyl for sedation with goal RASS 0 to -1 to allow for frequent neurochecks ?-DC Versed ?-will start on thiamine, folate, mvi ? ?Acute respiratory failure due to above ?P: ?-cont mech vent PRVC 6-8 cc/kg ?-wean fio2 for sats >92% ?-check CXR on arrival to ensure adequate ETT position ?-check abg 1 hours post ventilator initiation; adjust settings accordingly ?-VAP prevention in place ?-Daily SAT and SBT ? ? ?Sepsis ?Aspiration pneumonia ?1/2 staph epi likely contaminant ?P: ?-meropenem/vanc  ?-check mrsa pcr ?-check PCT ?-check sputum culture ? ? ?Hypokalemia ?P: ?-trend bmp/mag ?-replete electrolytes as needed ? ?Anemia ?P: ?-trend cbc ? ?Hx of MDD ?P: ?-no home meds listed ? ?Best Practice (right click and "Reselect all SmartList Selections" daily)  ? ?Diet/type: NPO w/ meds via tube ?DVT prophylaxis: SCD ?GI prophylaxis: PPI ?Lines: N/A ?Foley:  N/A ?Code Status:  full code ?Last date of multidisciplinary goals of care discussion [pending] ? ?Labs   ?CBC: ?No results for input(s): WBC, NEUTROABS, HGB, HCT, MCV, PLT in the last 168 hours. ? ?Basic Metabolic Panel: ?No results for input(s): NA, K, CL, CO2, GLUCOSE, BUN, CREATININE, CALCIUM, MG, PHOS in the last 168 hours. ?GFR: ?CrCl cannot be calculated (Patient's most recent lab result is older than the maximum 21 days allowed.). ?No results for input(s): PROCALCITON, WBC, LATICACIDVEN in the last 168 hours. ? ?Liver Function Tests: ?No results for input(s): AST, ALT, ALKPHOS, BILITOT, PROT, ALBUMIN in the last 168 hours. ?No results for input(s): LIPASE, AMYLASE in the last 168 hours. ?No results for input(s): AMMONIA in the last 168 hours. ? ?ABG ?No results  found for: PHART, PCO2ART, PO2ART, HCO3, TCO2, ACIDBASEDEF, O2SAT  ? ?Coagulation Profile: ?No results for input(s): INR, PROTIME in the last 168 hours. ? ?Cardiac Enzymes: ?No results for input(s): CKTOTAL, CKMB, CKMBINDEX, TROPONINI in the last 168 hours. ? ?HbA1C: ?No results found for: HGBA1C ? ?CBG: ?No results for input(s): GLUCAP in the last 168 hours. ? ?Review of Systems:   ?Patient is encephalopathic and/or intubated. Therefore history has been obtained from chart review.  c ? ?Past Medical History:  ?She,  has no past medical history on file.  ? ?Surgical History:  ?No past surgical history on file.  ? ?Social History:  ? reports that she has never smoked. She has never used smokeless tobacco. She reports current drug use. Frequency: 2.00 times per week. Drug: IV. She reports that she does not drink alcohol.  ? ?Family History:  ?Her family history is not on file.  ? ?Allergies ?Allergies  ?Allergen Reactions  ? Augmentin [Amoxicillin-Pot Clavulanate] Anaphylaxis  ?  ? ?Home Medications  ?Prior to Admission medications   ?Medication Sig Start Date End Date Taking? Authorizing Provider  ?HYDROcodone-acetaminophen (NORCO) 5-325 MG tablet Take 1-2 tablets by mouth every 4 (four) hours as needed for severe pain. 10/30/17   Sherwood Gambler, MD  ?naproxen (NAPROSYN) 375 MG tablet Take 1 tablet (375 mg total) by mouth 2 (two) times daily with a meal. 10/30/17   Sherwood Gambler, MD  ?ondansetron (ZOFRAN ODT) 4 MG disintegrating tablet Take 1 tablet (4 mg total)  by mouth every 8 (eight) hours as needed. 4mg  ODT q4 hours prn nausea/vomit 10/30/17   Sherwood Gambler, MD  ?potassium chloride SA (K-DUR,KLOR-CON) 20 MEQ tablet Take 1 tablet (20 mEq total) by mouth 2 (two) times daily for 3 days. 10/30/17 11/02/17  Sherwood Gambler, MD  ?  ? ?Critical care time: 45 minutes ?  ? ?Leanna Sato Elsworth Soho MD ?[facility] ?07/28/2021, 9:54 AM ? ?Please see [email or web address] for pager details. ? ?From 7A-7P if no response,  please call [phone]. ?After hours, please call Warren Lacy [phone]. ? ? ? ? ?

## 2021-07-28 NOTE — Progress Notes (Signed)
Pharmacy Antibiotic Note ? ?Angela Mathews is a 36 y.o. female admitted on 07/28/2021 with pneumonia and sepsis. Patient coming from OSH after being found down. Per Careeverywhere review, patient was started on IV vancomycin, IV ertapenem and IV flagyl. Ertapenem was transitioned to aztreonam due to amoxicillin allergy listed as anaphylaxis. Patient then transferred to [facility] ICU for further management. Blood culture results 1/2 positive for staph epidermidis. Pharmacy has been consulted for vancomycin and meropenem dosing. ? ?Per chart review, patient was on IV vancomycin 1250 mg q8h (assumed scheduled 0800-1600-0000) on trough-based dosing, with goal range 15-20 mg/L . Will order vancomycin random at 1500 today, assuming this will be close to next trough. Will move to AUC dosing.  ? ?WBC this AM within normal limits, patient is currently afebrile. Renal function appears to be within normal limits.  ? ?Vancomycin random level this evening = 11. ? ?Plan: ?Start meropenem IV 1 g q8h ?Start IV Vancomycin 1000 mg q12h (eAUC 514, Scr 0.8, 0.5 L/kg) ?Monitor renal function, blood cultures, and signs of clinical improvement ? ? ?Height: 5\' 4"  (162.6 cm) ?Weight: 106 kg (233 lb 11 oz) ?IBW/kg (Calculated) : 54.7 ? ?Temp (24hrs), Avg:98.7 ?F (37.1 ?C), Min:98.6 ?F (37 ?C), Max:98.8 ?F (37.1 ?C) ? ?Recent Labs  ?Lab 07/28/21 ?1451 07/28/21 ?1715  ?CREATININE 0.50  --   ?VANCORANDOM  --  11  ?  ?Estimated Creatinine Clearance: 114.3 mL/min (by C-G formula based on SCr of 0.5 mg/dL).   ? ?Allergies  ?Allergen Reactions  ? Augmentin [Amoxicillin-Pot Clavulanate] Anaphylaxis  ? ? ?Antimicrobials this admission: ?4/14 Vancomycin  >>  ?4/19 meropenem >>  ?4/14 ertapenem >> 4/17 (per chart review) ?4/17 aztreonam >> 4/19 (per chart review) ? ? ?Microbiology results: ?4/14 BCx: 1/2 staph epi (per chart review) ?4/14 UCx: pending  ?4/15 MRSA PCR: negative ? ?Thank you for involving pharmacy in this patient's care. ? ?5/15,  Pharm D, BCPS, BCCP ?Clinical Pharmacist ? 07/28/2021 6:36 PM  ? ?[facility] pharmacy phone numbers are listed on [email or web address] ? ?

## 2021-07-28 NOTE — Progress Notes (Addendum)
eLink Physician-Brief Progress Note ?Patient Name: Angela Mathews ?DOB: [birth year] ?MRN: [id number] ? ? ?Date of Service ? 07/28/2021  ?HPI/Events of Note ? Patient with vomiting of tube feed, abdomen is mildly distended but bowel sounds are present.  ?eICU Interventions ? EKG, KUB, and PRN Zofran ordered.  ? ? ? ?  ? ?Migdalia Dk ?07/28/2021, 10:32 PM ?

## 2021-07-28 NOTE — Progress Notes (Signed)
Wasted Versed that patient was started on at other hospital.  Could not waist in pixis due to no order at [facility].  [place] Charge witness waist .  ?

## 2021-07-28 NOTE — Progress Notes (Incomplete)
Pharmacy Antibiotic Note ? ?Angela Mathews is a 36 y.o. female admitted on 07/28/2021 with pneumonia and sepsis. Patient coming from OSH after being found down. Per Careeverywhere review, patient was started on IV vancomycin, IV ertapenem and IV flagyl. Ertapenem was transitioned to aztreonam due to amoxicillin allergy listed as anaphylaxis. Patient then transferred to [facility] ICU for further management. Blood culture results 1/2 positive for staph epidermidis. Pharmacy has been consulted for vancomycin and meropenem dosing. ? ?Per chart review, patient was on IV vancomycin 1250 mg q8h (assumed scheduled 0800-1600-0000) on trough-based dosing, with goal range 15-20 mg/L . Will order vancomycin random at 1500 today, assuming this will be close to next trough. Will move to AUC dosing.  ? ?WBC this AM within normal limits, patient is currently afebrile. Renal function appears to be within normal limits.  ? ?Plan: ?Start meropenem IV 1 g q8h ?Vancomycin random level @1500  ?If vanc random < 15, start IV Vancomycin 1000 mg q12h (eAUC 514, Scr 0.8, 0.5 L/kg) ?Monitor renal function, blood cultures, and signs of clinical improvement ? ? ?Height: 5\' 4"  (162.6 cm) ?Weight: 106 kg (233 lb 11 oz) ?IBW/kg (Calculated) : 54.7 ? ?Temp (24hrs), Avg:98.8 ?F (37.1 ?C), Min:98.8 ?F (37.1 ?C), Max:98.8 ?F (37.1 ?C) ? ?No results for input(s): WBC, CREATININE, LATICACIDVEN, VANCOTROUGH, VANCOPEAK, VANCORANDOM, GENTTROUGH, GENTPEAK, GENTRANDOM, TOBRATROUGH, TOBRAPEAK, TOBRARND, AMIKACINPEAK, AMIKACINTROU, AMIKACIN in the last 168 hours.  ?CrCl cannot be calculated (Patient's most recent lab result is older than the maximum 21 days allowed.).   ? ?Allergies  ?Allergen Reactions  ? Augmentin [Amoxicillin-Pot Clavulanate] Anaphylaxis  ? ? ?Antimicrobials this admission: ?4/14 Vancomycin  >>  ?4/19 meropenem >>  ?4/14 ertapenem >> 4/17 (per chart review) ?4/17 aztreonam >> 4/19 (per chart review) ? ? ?Microbiology results: ?4/14 BCx: 1/2 staph  epi (per chart review) ?4/14 UCx: pending  ?4/15 MRSA PCR: negative ? ?Thank you for involving pharmacy in this patient's care. ? ?5/14, PharmD ?PGY1 Ambulatory Care Pharmacy Resident ?07/28/2021 2:09 PM ? ?**Pharmacist phone directory can be found on [email or web address] listed under [facility]** ?

## 2021-07-28 NOTE — Progress Notes (Signed)
Brief Nutrition Note ? ?Consult received for enteral/tube feeding initiation and management. ? ?Pt with OG tube coursing below the diaphragm with a portion of the tube looped in the stomach. ? ?Adult Enteral Nutrition Protocol initiated. Full assessment to follow tomorrow, 07/29/21. ? ?Admitting Dx: Drug overdose [T50.901A] ? ?Labs: ? ?Recent Labs  ?Lab 07/28/21 ?1434  ?NA 144  ?K 3.7  ? ? ? ?Gustavus Bryant, MS, RD, LDN ?Inpatient Clinical Dietitian ?Please see AMiON for contact information. ? ?

## 2021-07-28 NOTE — Progress Notes (Signed)
Transported patient to MRI while on the ventilator. Patient remained stable during transport  

## 2021-07-28 NOTE — Consult Note (Addendum)
Neurology Consultation ? ?Reason for Consult: Transfer from [facility] mental status, abnormal CT head ?Referring Physician: Dr. Kara Mead ? ?CC: Altered mental status, bilateral basal ganglia hypodensities on CT ? ?History is obtained from: Chart ? ?HPI: Angela Mathews is a 36 y.o. female past medical history of polysubstance abuse presented to [facility] on 07/23/2021 with altered mental status.  Per H&P, drug paraphernalia including needles was found around her.  She had pinpoint pupils and was unresponsive with minimal response to Narcan.  She also had some emesis.  Was tachypneic and was being bagged during transportation to the ED.  Received another dose of Narcan in the ED with minimal improvement-some response to noxious stimulation.  Intubated for airway protection. ?Further work-up revealed a UDS that was positive for amphetamines, opiates and benzodiazepines.  Chest x-ray with multilobar bilateral pneumonia concerning for aspiration pneumonia.  Blood culture 1 of 2 with Staph epidermidis likely contaminant.  Started on vancomycin and Zosyn.  Elevated lactate.  CT head with hypodensity in the right lentiform nucleus with also possible left lentiform nucleus hypodensity.  Continue to have leukocytosis, antibiotic coverage expanded, repeat CT head done 07/26/2021 with increased in the base of any hypodensity.  No MRI on vented patient is available at the hospital.  Transported to [facility] for further critical care evaluation and evaluation for possible stroke on CT. ?She had a fever of 101.3 yesterday morning. ?Antibiotics were changed from vancomycin and Zosyn to meropenem and vancomycin. ? ? ?LKW: Prior to 07/23/2021 ?tpa given?: no, was seen at an outside hospital-being seen by neurology on 07/28/2021 at 3:30 PM ?Premorbid modified Rankin scale (mRS): Unable to ascertain-patient able to provide history ? ?ROS: Unable to obtain due to altered mental status.  ? ?No past medical  history on file. ? ?No family history on file. ? ? ?Social History:  ? reports that she has never smoked. She has never used smokeless tobacco. She reports current drug use. Frequency: 2.00 times per week. Drug: IV. She reports that she does not drink alcohol. ? ?Medications ? ?Current Facility-Administered Medications:  ?  0.9 %  sodium chloride infusion, , Intravenous, PRN, Rigoberto Noel, MD, Last Rate: 10 mL/hr at 07/28/21 1454, New Bag at 07/28/21 1454 ?  acetaminophen (TYLENOL) tablet 650 mg, 650 mg, Per Tube, Q6H PRN, Mick Sell, PA-C ?  chlorhexidine gluconate (MEDLINE KIT) (PERIDEX) 0.12 % solution 15 mL, 15 mL, Mouth Rinse, BID, Rigoberto Noel, MD ?  dexmedetomidine (PRECEDEX) 400 MCG/100ML (4 mcg/mL) infusion, 0-1.2 mcg/kg/hr, Intravenous, Continuous, Mick Sell, PA-C, Last Rate: 10.6 mL/hr at 07/28/21 1527, 0.4 mcg/kg/hr at 07/28/21 1527 ?  docusate (COLACE) 50 MG/5ML liquid 100 mg, 100 mg, Per Tube, BID, Mick Sell, PA-C, 100 mg at 07/28/21 1445 ?  enoxaparin (LOVENOX) injection 52.5 mg, 0.5 mg/kg, Subcutaneous, Q24H, Mick Sell, PA-C, 52.5 mg at 07/28/21 1447 ?  feeding supplement (PROSource TF) liquid 45 mL, 45 mL, Per Tube, BID, Rigoberto Noel, MD ?  feeding supplement (VITAL HIGH PROTEIN) liquid 1,000 mL, 1,000 mL, Per Tube, Q24H, Rigoberto Noel, MD ?  fentaNYL (SUBLIMAZE) injection 50 mcg, 50 mcg, Intravenous, Q15 min PRN, Mick Sell, PA-C ?  fentaNYL (SUBLIMAZE) injection 50-200 mcg, 50-200 mcg, Intravenous, Q30 min PRN, Mick Sell, PA-C, 100 mcg at 51/02/58 5277 ?  folic acid (FOLVITE) tablet 1 mg, 1 mg, Per Tube, Daily, Andres Labrum D, PA-C, 1 mg at 07/28/21 1445 ?  MEDLINE mouth  rinse, 15 mL, Mouth Rinse, 10 times per day, Rigoberto Noel, MD, 15 mL at 07/28/21 1538 ?  meropenem (MERREM) 1 g in sodium chloride 0.9 % 100 mL IVPB, 1 g, Intravenous, Q8H, [place], [place], [place], Last Rate: 200 mL/hr at 07/28/21 1456, 1 g at 07/28/21 1456 ?  multivitamin with minerals tablet 1  tablet, 1 tablet, Per Tube, Daily, Mick Sell, PA-C, 1 tablet at 07/28/21 1445 ?  pantoprazole sodium (PROTONIX) 40 mg/20 mL oral suspension 40 mg, 40 mg, Per Tube, Daily, Mick Sell, PA-C, 40 mg at 07/28/21 1445 ?  polyethylene glycol (MIRALAX / GLYCOLAX) packet 17 g, 17 g, Per Tube, Daily, Mick Sell, PA-C, 17 g at 07/28/21 1458 ?  thiamine (B-1) 250 mg in sodium chloride 0.9 % 50 mL IVPB, 250 mg, Intravenous, BID, Mick Sell, PA-C ?  [START ON 07/30/2021] thiamine (B-1) 250 mg in sodium chloride 0.9 % 50 mL IVPB, 250 mg, Intravenous, Daily, Mick Sell, PA-C ?  [START ON 08/02/2021] thiamine (B-1) injection 100 mg, 100 mg, Intravenous, Daily, Mick Sell, PA-C ? ?Exam: ?Current vital signs: ?BP 122/79   Pulse 75   Temp 98.6 ?F (37 ?C) (Axillary)   Ht 5' 4"  (1.626 m)   Wt 106 kg   SpO2 100%   BMI 40.11 kg/m?  ?Vital signs in last 24 hours: ?Temp:  [98.6 ?F (37 ?C)-98.8 ?F (37.1 ?C)] 98.6 ?F (37 ?C) (04/19 1510) ?Pulse Rate:  [75-80] 75 (04/19 1500) ?BP: (119-122)/(70-79) 122/79 (04/19 1500) ?SpO2:  [100 %] 100 % (04/19 1433) ?FiO2 (%):  [40 %] 40 % (04/19 1535) ?Weight:  [106 kg] 106 kg (04/19 1330) ?General: Intubated, sedated on Precedex.  From the outside hospital was sent on a Versed drip which was discontinued on arrival to [facility]. ?HEENT: Normocephalic atraumatic ?CVS: Regular rate rhythm ?Respiratory: Vented ?Neurological exam ?Sedated on Precedex ?Intubated ?No spontaneous movement ?Does not follow any commands ?Cranial nerves: Pupils equal round react light, no gaze preference or deviation, does not blink to threat from either side, face appears grossly symmetric. ?Motor examination with no spontaneous movement.  To noxious stimulation has good localization with the left upper extremity and somewhat diminished localization with right upper extremity.  Withdraws both lower extremities extremely strongly to noxious simulation. ?Sensation as above ?Coordination difficult to assess due  to her mentation ?Gait testing cannot be assessed due to her intubated status ?NIHSS 24 ? ? ?Labs ?I have reviewed labs in epic and the results pertinent to this consultation are: ? ? ?CBC ?   ?Component Value Date/Time  ? WBC 5.1 10/30/2017 0726  ? RBC 4.20 10/30/2017 0726  ? HGB 8.5 (L) 07/28/2021 1434  ? HCT 25.0 (L) 07/28/2021 1434  ? PLT 284 10/30/2017 0726  ? MCV 92.4 10/30/2017 0726  ? MCH 30.0 10/30/2017 0726  ? MCHC 32.5 10/30/2017 0726  ? RDW 13.4 10/30/2017 0726  ? LYMPHSABS 1.5 10/30/2017 0726  ? MONOABS 0.5 10/30/2017 0726  ? EOSABS 0.1 10/30/2017 0726  ? BASOSABS 0.0 10/30/2017 0726  ? ? ?CMP  ?   ?Component Value Date/Time  ? NA 144 07/28/2021 1434  ? K 3.7 07/28/2021 1434  ? CL 106 10/30/2017 0726  ? CO2 26 10/30/2017 0726  ? GLUCOSE 122 (H) 10/30/2017 0726  ? BUN 8 10/30/2017 0726  ? CREATININE 0.75 10/30/2017 0726  ? CALCIUM 9.4 10/30/2017 0726  ? PROT 7.1 10/30/2017 0726  ? ALBUMIN 3.9 10/30/2017 0726  ?  AST 16 10/30/2017 0726  ? ALT 14 10/30/2017 0726  ? ALKPHOS 56 10/30/2017 0726  ? BILITOT 0.7 10/30/2017 0726  ? GFRNONAA >60 10/30/2017 0726  ? GFRAA >60 10/30/2017 0726  ? ?Urinary toxicology screen 07/23/2021 at [facility] amphetamine, positive benzodiazepine, positive opiate. ? ?Imaging ?I have reviewed the images obtained: ? ?CT-head reviewed in PACS ?07/26/2021 motion degraded-hypodensity noted in bilateral basal ganglia which appears somewhat larger than prior exam and somewhat symmetric.  May represent evolving infarcts-a metabolic process such as carbon monoxide also could have similar appearance. ? ?Assessment:  ?36 year old presented to an outside hospital for altered mental status with urinary toxicology screen positive for amphetamines, benzodiazepines and opiates and CT head concerning for bilateral basal ganglia infarct. ?Remains intubated and difficult to arouse and transferred to [facility] for further imaging and neurological evaluation as well as ongoing  critical care. ?Unclear of the preceding events-differentials do include basal ganglia strokes given history of drug use and positive urinary toxicology screen versus basal ganglia involvement from drug use versus

## 2021-07-29 ENCOUNTER — Inpatient Hospital Stay (HOSPITAL_COMMUNITY): Payer: Self-pay

## 2021-07-29 DIAGNOSIS — I639 Cerebral infarction, unspecified: Secondary | ICD-10-CM

## 2021-07-29 DIAGNOSIS — J9600 Acute respiratory failure, unspecified whether with hypoxia or hypercapnia: Secondary | ICD-10-CM

## 2021-07-29 DIAGNOSIS — J690 Pneumonitis due to inhalation of food and vomit: Secondary | ICD-10-CM

## 2021-07-29 DIAGNOSIS — T50904A Poisoning by unspecified drugs, medicaments and biological substances, undetermined, initial encounter: Secondary | ICD-10-CM

## 2021-07-29 DIAGNOSIS — G9340 Encephalopathy, unspecified: Secondary | ICD-10-CM

## 2021-07-29 LAB — CBC
HCT: 28.5 % — ABNORMAL LOW (ref 36.0–46.0)
Hemoglobin: 9.3 g/dL — ABNORMAL LOW (ref 12.0–15.0)
MCH: 30.5 pg (ref 26.0–34.0)
MCHC: 32.6 g/dL (ref 30.0–36.0)
MCV: 93.4 fL (ref 80.0–100.0)
Platelets: 195 10*3/uL (ref 150–400)
RBC: 3.05 MIL/uL — ABNORMAL LOW (ref 3.87–5.11)
RDW: 14.4 % (ref 11.5–15.5)
WBC: 5.6 10*3/uL (ref 4.0–10.5)
nRBC: 0 % (ref 0.0–0.2)

## 2021-07-29 LAB — GLUCOSE, CAPILLARY
Glucose-Capillary: 109 mg/dL — ABNORMAL HIGH (ref 70–99)
Glucose-Capillary: 118 mg/dL — ABNORMAL HIGH (ref 70–99)
Glucose-Capillary: 125 mg/dL — ABNORMAL HIGH (ref 70–99)
Glucose-Capillary: 168 mg/dL — ABNORMAL HIGH (ref 70–99)
Glucose-Capillary: 169 mg/dL — ABNORMAL HIGH (ref 70–99)
Glucose-Capillary: 91 mg/dL (ref 70–99)
Glucose-Capillary: 94 mg/dL (ref 70–99)

## 2021-07-29 LAB — URINALYSIS, ROUTINE W REFLEX MICROSCOPIC
Bacteria, UA: NONE SEEN
Bilirubin Urine: NEGATIVE
Glucose, UA: NEGATIVE mg/dL
Ketones, ur: 80 mg/dL — AB
Leukocytes,Ua: NEGATIVE
Nitrite: NEGATIVE
Protein, ur: 30 mg/dL — AB
RBC / HPF: >50 RBC/hpf — ABNORMAL HIGH (ref 0–5)
Specific Gravity, Urine: 1.026 (ref 1.005–1.030)
pH: 6 (ref 5.0–8.0)

## 2021-07-29 LAB — COMPREHENSIVE METABOLIC PANEL
ALT: 17 U/L (ref 0–44)
AST: 19 U/L (ref 15–41)
Albumin: 2.5 g/dL — ABNORMAL LOW (ref 3.5–5.0)
Alkaline Phosphatase: 48 U/L (ref 38–126)
Anion gap: 7 (ref 5–15)
BUN: 8 mg/dL (ref 6–20)
CO2: 22 mmol/L (ref 22–32)
Calcium: 8.4 mg/dL — ABNORMAL LOW (ref 8.9–10.3)
Chloride: 110 mmol/L (ref 98–111)
Creatinine, Ser: 0.53 mg/dL (ref 0.44–1.00)
GFR, Estimated: >60 mL/min (ref >60)
Glucose, Bld: 135 mg/dL — ABNORMAL HIGH (ref 70–99)
Potassium: 4.1 mmol/L (ref 3.5–5.1)
Sodium: 139 mmol/L (ref 135–145)
Total Bilirubin: 0.5 mg/dL (ref 0.3–1.2)
Total Protein: 6.1 g/dL — ABNORMAL LOW (ref 6.5–8.1)

## 2021-07-29 LAB — PHOSPHORUS: Phosphorus: 2.2 mg/dL — ABNORMAL LOW (ref 2.5–4.6)

## 2021-07-29 LAB — MAGNESIUM: Magnesium: 2.1 mg/dL (ref 1.7–2.4)

## 2021-07-29 MED ORDER — PHENOBARBITAL 32.4 MG PO TABS: 97.2000 mg | ORAL_TABLET | Freq: Three times a day (TID) | ORAL | Status: DC

## 2021-07-29 MED ORDER — LORAZEPAM 2 MG/ML IJ SOLN
1.0000 mg | INTRAMUSCULAR | Status: DC | PRN
  Administered 2021-07-29 – 2021-07-30 (×2): 1 mg via INTRAVENOUS
  Filled 2021-07-29 (×2): qty 1

## 2021-07-29 MED ORDER — PHENOBARBITAL 32.4 MG PO TABS: 32.4000 mg | ORAL_TABLET | Freq: Three times a day (TID) | ORAL | Status: DC

## 2021-07-29 MED ORDER — PHENOBARBITAL SODIUM 130 MG/ML IJ SOLN
97.5000 mg | Freq: Three times a day (TID) | INTRAMUSCULAR | Status: DC
  Administered 2021-07-29 – 2021-07-30 (×2): 97.5 mg via INTRAVENOUS
  Filled 2021-07-29 (×2): qty 1

## 2021-07-29 MED ORDER — PHENOBARBITAL SODIUM 65 MG/ML IJ SOLN: 65.0000 mg | Freq: Three times a day (TID) | INTRAMUSCULAR | Status: DC

## 2021-07-29 MED ORDER — NYSTATIN 100000 UNIT/GM EX CREA
TOPICAL_CREAM | Freq: Three times a day (TID) | CUTANEOUS | Status: DC
  Filled 2021-07-29: qty 30

## 2021-07-29 MED ORDER — SODIUM PHOSPHATES 45 MMOLE/15ML IV SOLN
15.0000 mmol | Freq: Once | INTRAVENOUS | Status: AC
  Administered 2021-07-29: 15 mmol via INTRAVENOUS
  Filled 2021-07-29: qty 5

## 2021-07-29 MED ORDER — PHENOBARBITAL SODIUM 65 MG/ML IJ SOLN: 32.5000 mg | Freq: Three times a day (TID) | INTRAMUSCULAR | Status: DC

## 2021-07-29 MED ORDER — SENNA 8.6 MG PO TABS
1.0000 | ORAL_TABLET | Freq: Every day | ORAL | Status: DC
  Administered 2021-07-29: 8.6 mg
  Filled 2021-07-29: qty 1

## 2021-07-29 MED ORDER — BISACODYL 10 MG RE SUPP
10.0000 mg | Freq: Once | RECTAL | Status: DC
  Filled 2021-07-29: qty 1

## 2021-07-29 MED ORDER — PHENOBARBITAL 32.4 MG PO TABS: 64.8000 mg | ORAL_TABLET | Freq: Three times a day (TID) | ORAL | Status: DC

## 2021-07-29 NOTE — Progress Notes (Addendum)
Neurology Progress Note ? ? ?S:// ?Seen and examined ?Off of Precedex drip this morning for waking trial. ? ? ?O:// ?Current vital signs: ?BP (!) 151/87   Pulse 67   Temp 99.1 ?F (37.3 ?C) (Axillary)   Resp (!) 22   Ht $R'5\' 4"'lj$  (1.626 m)   Wt 106.9 kg   SpO2 100%   BMI 40.45 kg/m?  ?Vital signs in last 24 hours: ?Temp:  [98.6 ?F (37 ?C)-99.9 ?F (37.7 ?C)] 99.1 ?F (37.3 ?C) (04/20 4765) ?Pulse Rate:  [52-80] 67 (04/20 0726) ?Resp:  [14-23] 22 (04/20 0726) ?BP: (119-152)/(70-111) 151/87 (04/20 0600) ?SpO2:  [93 %-100 %] 100 % (04/20 0726) ?FiO2 (%):  [40 %] 40 % (04/20 0726) ?Weight:  [106 kg-106.9 kg] 106.9 kg (04/20 0500) ?General: Intubated, sedation off this morning ?HEENT: Normocephalic, atraumatic ?CVS: Regular rhythm ?Respiratory: Vented ?Neurologic exam ?Sedation held since this morning ?Intubated ?No spontaneous movements ?Does not open eyes to voice ?Does not follow commands ?Cranial nerves: Pupils equal round reactive to light, no gaze preference or deviation, does not blink to threat from either side, face appears grossly symmetric ?Motor examination with no sponges movement.  To noxious stimulation weak localization in both upper extremities compared to yesterday.  Strong withdrawal in the lower extremities to noxious simulation. ?Sensory exam: As above ?Coordination difficult to assess ?Gait testing cannot be done due to her mentation and intubated status ? ? ?Medications ? ?Current Facility-Administered Medications:  ?  0.9 %  sodium chloride infusion, , Intravenous, PRN, Rigoberto Noel, MD, Paused at 07/28/21 1946 ?  acetaminophen (TYLENOL) tablet 650 mg, 650 mg, Per Tube, Q6H PRN, Andres Labrum D, PA-C ?  chlorhexidine gluconate (MEDLINE KIT) (PERIDEX) 0.12 % solution 15 mL, 15 mL, Mouth Rinse, BID, Rigoberto Noel, MD, 15 mL at 07/28/21 2149 ?  Chlorhexidine Gluconate Cloth 2 % PADS 6 each, 6 each, Topical, Daily, Rigoberto Noel, MD, 6 each at 07/28/21 2350 ?  dexmedetomidine (PRECEDEX) 400  MCG/100ML (4 mcg/mL) infusion, 0-1.2 mcg/kg/hr, Intravenous, Continuous, Mick Sell, PA-C, Last Rate: 23.9 mL/hr at 07/29/21 0626, 0.9 mcg/kg/hr at 07/29/21 4650 ?  docusate (COLACE) 50 MG/5ML liquid 100 mg, 100 mg, Per Tube, BID, Mick Sell, PA-C, 100 mg at 07/28/21 1445 ?  enoxaparin (LOVENOX) injection 52.5 mg, 0.5 mg/kg, Subcutaneous, Q24H, Mick Sell, PA-C, 52.5 mg at 07/28/21 1447 ?  feeding supplement (PROSource TF) liquid 45 mL, 45 mL, Per Tube, BID, Rigoberto Noel, MD ?  feeding supplement (VITAL HIGH PROTEIN) liquid 1,000 mL, 1,000 mL, Per Tube, Q24H, Rigoberto Noel, MD, 1,000 mL at 07/28/21 1651 ?  fentaNYL (SUBLIMAZE) injection 50 mcg, 50 mcg, Intravenous, Q15 min PRN, Mick Sell, PA-C ?  fentaNYL (SUBLIMAZE) injection 50-200 mcg, 50-200 mcg, Intravenous, Q30 min PRN, Mick Sell, PA-C, 100 mcg at 35/46/56 8127 ?  folic acid (FOLVITE) tablet 1 mg, 1 mg, Per Tube, Daily, Andres Labrum D, PA-C, 1 mg at 07/28/21 1445 ?  MEDLINE mouth rinse, 15 mL, Mouth Rinse, 10 times per day, Rigoberto Noel, MD, 15 mL at 07/29/21 0605 ?  meropenem (MERREM) 1 g in sodium chloride 0.9 % 100 mL IVPB, 1 g, Intravenous, Q8H, [place], [place], [place], Last Rate: 200 mL/hr at 07/29/21 5170, Infusion Verify at 07/29/21 0174 ?  multivitamin with minerals tablet 1 tablet, 1 tablet, Per Tube, Daily, Mick Sell, PA-C, 1 tablet at 07/28/21 1445 ?  pantoprazole sodium (PROTONIX) 40 mg/20 mL oral suspension 40  mg, 40 mg, Per Tube, Daily, Mick Sell, PA-C, 40 mg at 07/28/21 1445 ?  polyethylene glycol (MIRALAX / GLYCOLAX) packet 17 g, 17 g, Per Tube, Daily, Mick Sell, PA-C, 17 g at 07/28/21 1458 ?  sodium chloride flush (NS) 0.9 % injection 10-40 mL, 10-40 mL, Intracatheter, Q12H, Rigoberto Noel, MD, 10 mL at 07/28/21 2350 ?  sodium chloride flush (NS) 0.9 % injection 10-40 mL, 10-40 mL, Intracatheter, PRN, Rigoberto Noel, MD ?  sodium phosphate 15 mmol in dextrose 5 % 250 mL infusion, 15 mmol, Intravenous, Once,  Ogan, Kerry Kass, MD, Last Rate: 43 mL/hr at 07/29/21 0638, 15 mmol at 07/29/21 0638 ?  thiamine (B-1) 250 mg in sodium chloride 0.9 % 50 mL IVPB, 250 mg, Intravenous, BID, Mick Sell, PA-C, Stopped at 07/28/21 2205 ?  [START ON 07/30/2021] thiamine (B-1) 250 mg in sodium chloride 0.9 % 50 mL IVPB, 250 mg, Intravenous, Daily, Mick Sell, PA-C ?  [START ON 08/02/2021] thiamine (B-1) injection 100 mg, 100 mg, Intravenous, Daily, Mick Sell, PA-C ?Labs ?CBC ?   ?Component Value Date/Time  ? WBC 5.8 07/28/2021 2002  ? RBC 3.08 (L) 07/28/2021 2002  ? HGB 9.6 (L) 07/28/2021 2002  ? HCT 29.0 (L) 07/28/2021 2002  ? PLT 174 07/28/2021 2002  ? MCV 94.2 07/28/2021 2002  ? MCH 31.2 07/28/2021 2002  ? MCHC 33.1 07/28/2021 2002  ? RDW 14.7 07/28/2021 2002  ? LYMPHSABS 1.5 10/30/2017 0726  ? MONOABS 0.5 10/30/2017 0726  ? EOSABS 0.1 10/30/2017 0726  ? BASOSABS 0.0 10/30/2017 0726  ? ? ?CMP  ?   ?Component Value Date/Time  ? NA 139 07/29/2021 0310  ? K 4.1 07/29/2021 0310  ? CL 110 07/29/2021 0310  ? CO2 22 07/29/2021 0310  ? GLUCOSE 135 (H) 07/29/2021 0310  ? BUN 8 07/29/2021 0310  ? CREATININE 0.53 07/29/2021 0310  ? CALCIUM 8.4 (L) 07/29/2021 0310  ? PROT 6.1 (L) 07/29/2021 0310  ? ALBUMIN 2.5 (L) 07/29/2021 0310  ? AST 19 07/29/2021 0310  ? ALT 17 07/29/2021 0310  ? ALKPHOS 48 07/29/2021 0310  ? BILITOT 0.5 07/29/2021 0310  ? GFRNONAA >60 07/29/2021 0310  ? GFRAA >60 10/30/2017 0726  ? ? ? ?Imaging ?I have reviewed the images obtained: ?  ?CT-head reviewed in PACS ?07/26/2021 motion degraded-hypodensity noted in bilateral basal ganglia which appears somewhat larger than prior exam and somewhat symmetric.  May represent evolving infarcts-a metabolic process such as carbon monoxide also could have similar appearance. ? ?MRI of the brain with bilateral globus pallidus hyperintensities suggestive of hypoxic injury versus more likely injury related to illicit drug use/toxin exposure including THC. ? ?MR angiography of the  head: Normal. ?  ?Assessment:  ?36 year old presented to an outside hospital for altered mental status with urinary toxicology screen positive for amphetamines, benzodiazepines and opiates and CT head concerning for bilateral basal ganglia infarct. ?Remains intubated and difficult to arouse and transferred to [facility] for further imaging and neurological evaluation as well as ongoing critical care. ?Concern for bilateral basal ganglia infarcts but on MRI, the appearance of those areas is most consistent with injury seen with drug/toxin exposure versus hypoxic injury.  Current clinical presentation consistent with toxic metabolic encephalopathy due to drug use.  Due to the involvement of bilateral globus pallidus, her wakefulness might take a while to recover. ?I would expect her mentation to improve over time given the bilateral globus pallidus involvement.  She  was also on Versed drip for many days before getting to [facility].  Chest imaging is also suggestive of pretty dense aspiration pneumonia in the right lung which might be contributing to her encephalopathy as well. ? ? ?Impression ?Toxic metabolic encephalopathy-likely secondary to drug use ?MRI changes not consistent with stroke ? ? ?Recommendations: ?Continue to give her sedation holiday and do wake-up assessments ?Minimize sedation as tolerated ?Treatment of the aspiration pneumonia per primary team as you are ?Routine EEG is pending at this time. ?If that shows any abnormalities, will treat and reassess. ?We will continue to follow ?She might take a while to come back to her normal mentation given bilateral deep gray matter involvement which might also affect wakefulness. ?Plan was discussed with Dr. Elsworth Soho PCCM ? ?-- ?Amie Portland, MD ?Neurologist ?[facility] ?Pager: [phone] ? ?CRITICAL CARE ATTESTATION ?Performed by: Amie Portland, MD ?Total critical care time: 30 minutes ?Critical care time was exclusive of  separately billable procedures and treating other patients and/or supervising APPs/Residents/Students ?Critical care was necessary to treat or prevent imminent or life-threatening deterioration due to toxic metabolic encephalopa

## 2021-07-29 NOTE — Progress Notes (Signed)
VASCULAR LAB ? ? ? ?Carotid duplex has been performed. ? ?See CV proc for preliminary results. ? ? ?Kaelene Elliston, RVT ?07/29/2021, 9:40 AM ? ?

## 2021-07-29 NOTE — Procedures (Signed)
Patient Name: Angela Mathews  ?MRN: [id number]  ?Epilepsy Attending: Charlsie Quest  ?Referring Physician/Provider: Milon Dikes, MD ?Date: 07/29/2021 ?Duration: 23.02 mins ? ?Patient history: 36 year old female with altered mental status.  EEG to evaluate for seizure. ? ?Level of alertness: awake, Asleep ? ?AEDs during EEG study: None ? ?Technical aspects: This EEG study was done with scalp electrodes positioned according to the 10-20 International system of electrode placement. Electrical activity was acquired at a sampling rate of 500Hz  and reviewed with a high frequency filter of 70Hz  and a low frequency filter of 1Hz . EEG data were recorded continuously and digitally stored.  ? ?Description: During awake state, no clear posterior dominant rhythm was seen. EEG showed continuous generalized 3 to 6 Hz theta-delta slowing. Sleep was characterized by vertex waves, sleep spindles (12 to 14 Hz), maximal frontocentral region. Hyperventilation and photic stimulation were not performed.    ? ?ABNORMALITY ?- Continuous slow, generalized ? ?IMPRESSION: ?This study is suggestive of moderate diffuse encephalopathy, nonspecific etiology. No seizures or epileptiform discharges were seen throughout the recording. ? ?  ? ?

## 2021-07-29 NOTE — Progress Notes (Signed)
Spoke with mother Jackelyn Poling at bedside and updated on daughter's plan of care today.  ? ?Patient waking up more now off sedation. MAE on command, lifts neck off pillow, stick tongue out on command. Will do SBT and give her more time this morning to wake up more. Consider extubation later if passes SBT. ? ?Mikki Harbor, PA-C ?[facility] ?07/29/2021, 10:29 AM ? ?Please see [email or web address] for pager details. ? ?From 7A-7P if no response, please call [phone]. ?After hours, please call ELink [phone]. ? ?

## 2021-07-29 NOTE — Progress Notes (Signed)
EEG complete - results pending 

## 2021-07-29 NOTE — Procedures (Signed)
Extubation Procedure Note ? ?Patient Details:   ?Name: Angela Mathews ?DOB: 06/30/1985 ?MRN: [id number] ?  ?Airway Documentation:  ?Airway 7.5 mm (Active)  ?Secured at (cm) 23 cm 07/29/21 0726  ?Measured From Lips 07/29/21 0726  ?Secured Location Left 07/29/21 0726  ?Secured By [employer] 07/29/21 0726  ?Tube Holder Repositioned Yes 07/29/21 0726  ?Prone position No 07/29/21 0726  ?Cuff Pressure (cm H2O) Clear OR 27-39 CmH2O 07/29/21 0726  ?Site Condition Dry 07/29/21 0726  ? ?Vent end date: (not recorded) Vent end time: (not recorded)  ? ?Evaluation ? O2 sats: stable throughout ?Complications: No apparent complications ?Patient did tolerate procedure well. ?Bilateral Breath Sounds: Rhonchi ?  ?Yes ?Patient extubated to 5 L [place] sat 100%. No labored breathing noted at this time. No stridor at this time, Positive cuff leak. Patient able to say name. RT attempt to instruct patient on IS, patient unable to coordinate at this time. CPT done through the bed. RT will continue to monitor. ? ?[name] ?07/29/2021, 1:11 PM ? ?

## 2021-07-29 NOTE — Progress Notes (Signed)
Brief Nutrition Note ? ?RD received consult yesterday for enteral/tube feeding initiation and management. Adult Enteral Nutrition Protocol was initiated yesterday. ? ?Discussed pt with RN and during ICU rounds. Pt extubated this afternoon and no longer has enteral access. Plan for diet advancement per protocol. ? ?No nutrition interventions warranted at this time. If nutrition issues arise, please re-consult RD.  ? ? ?Mertie Clause, MS, RD, LDN ?Inpatient Clinical Dietitian ?Please see AMiON for contact information. ? ? ?

## 2021-07-29 NOTE — Progress Notes (Signed)
Patient was transported to CT and back to 3M10 via the ventilator with no complications.   ?

## 2021-07-29 NOTE — Progress Notes (Signed)
eLink Physician-Brief Progress Note ?Patient Name: Angela Mathews ?DOB: 11/15/1985 ?MRN: [id number] ? ? ?Date of Service ? 07/29/2021  ?HPI/Events of Note ? Patient is at risk of falling due to trying to climb out of bed, bedside RN requesting [facility] restraints.  ?eICU Interventions ? [employer] ordered.  ? ? ? ?  ? ?Kerry Kass Avalene Sealy ?07/29/2021, 8:00 PM ?

## 2021-07-29 NOTE — Progress Notes (Signed)
? ?NAME:  Angela Mathews, MRN:  [id number], DOB:  07/24/1985, LOS: 1 ?ADMISSION DATE:  07/28/2021, CONSULTATION DATE:  4/19 ?REFERRING MD:  Dr. Sharia Reeve , CHIEF COMPLAINT:  b/l basal ganglia stroke  ? ?History of Present Illness:  ?Patient is a 36 yo F w/ pertinent PMH of polysubstance abuse presents to [facility] ED on 4/14 with AMS. ? ?EMS found patient on 4/14 with drugs and needles lying next to her. Pupils pinpoint. Patient was unresponsive with minimal response to Narcan. She did have some emesis. Patient tachypneic and was being bagged and transported to [facility] ED on 4/14. There patient was given another dose of IV Narcan with some response to painful stimuli. Patient was intubated for airway protection. UDS positive for amphetamines, opiates, and benzos. CXR showing multilobar bilateral bronchopneumoni concerning for aspiration pneumonia. BC 1/2 showing staph epidermis (likely contaminate). Started on Vanc and zosyn. Elevated LA. Sepsis protocol initiated and given IV fluids. CT head on 4/14 showed 0.9 mm hypodensity at the right lentiform nucleus, with additional possible 7 mm hypodensity at the contralateral left lentiform nucleus. Findings are age indeterminate, but could reflect a small acute ischemic infarcts. ? ?Patient WBC remained elevated and febrile; abx broadened to IV Azactam, Flagyl and vancomycin. UA/UC negative. On 4/17 repeat CT head done due to inability to wake up and it shows bilateral basal ganglia hypodensity which is somewhat larger than prior exam. Concern for evolving infarct or metabolic process.  ? ?On 4/18, remains intubated, had fever spike of 101.3 earlier this morning. Aztreonam changed to Meropenem. ? ?4/19 patient being transported to [facility] for further workup for possible b/l basal ganglia stroke. ? ?Pertinent  Medical History  ?Polysubstance abuse  ?Alcohol use ? ?Significant Hospital Events: ?Including procedures, antibiotic start and stop dates in addition to other  pertinent events   ?4/14: admitted to [facility] ED for OD; intubated; abx started for aspiration pneumonia. CT head: showed 0.9 mm hypodensity at the right lentiform nucleus, with additional possible 7 mm hypodensity at the contralateral left lentiform nucleus. Findings are age indeterminate, but could reflect a small acute ischemic infarcts. ?4/17 repeat CT head done due to inability to wake up and it shows bilateral basal ganglia hypodensity which is somewhat larger than prior exam. Concern for evolving infarct or metabolic process. Recommend MRI. ?4/19: transferring to [facility] for further workup for possible b/l basal ganglia stroke; remains intubated. Was on Versed drip and switched to precedex. MRI showing symmetric diffusion restriction and peripheral contrast enhancement within the globi pallidi; likely associated with toxin/drug exposure. ?4/20: received Ct abd/pelvis and RUQ US showing chololithiasis without cholecystitis. ? ?Interim History / Subjective:  ? ?Patient had episode of vomiting TF overnight w/ mildly distended abd. KUB ordered showing questionable dilated bowel. CT abd showing no bowel obstruction. RuQ US showing chololithiasis without cholecystitis. ? ?Remains intubated on PRVC ?On precedex this am. Not following commands but withdrawal extremities to pain.  ? ?Objective   ?Blood pressure (!) 151/87, pulse 66, temperature 99.1 ?F (37.3 ?C), temperature source Axillary, resp. rate (!) 23, height 5\' 4"  (1.626 m), weight 106.9 kg, SpO2 100 %. ?   ?Vent Mode: PRVC ?FiO2 (%):  [40 %] 40 % ?Set Rate:  [18 bmp] 18 bmp ?Vt Set:  [430 mL-470 mL] 430 mL ?PEEP:  [5 cmH20] 5 cmH20 ?Plateau Pressure:  [13 cmH20-16 cmH20] 16 cmH20  ? ?Intake/Output Summary (Last 24 hours) at 07/29/2021 0710 ?Last data filed at 07/29/2021 [phone] ?Gross per 24 hour  ?  Intake 1224.51 ml  ?Output 1025 ml  ?Net 199.51 ml  ? ?Filed Weights  ? 07/28/21 1330 07/29/21 0500  ?Weight: 106 kg 106.9 kg  ? ? ?Examination: ? ?General:   critically ill appearing young woman on mech vent ?HEENT: MM pink/moist; ETT in place ?Neuro: On precedex; not following commands but withdrawal extremities to pain; perrl  ?CV: s1s2, no m/r/g ?PULM: No accessory muscle use, bilateral ventilated breath sounds; intubated on mech vent prvc ?GI: soft, bsx4 active ?Extremities: warm/dry, no edema ?Skin: macupapular rash in b/l arm pits, b/l groin, underneath breast ? ? ?Resolved Hospital Problem list   ?Hyponatremia ? ?Assessment & Plan:  ? ?Possible B/l basal ganglia stroke: CT head on 4/14 showed 0.9 mm hypodensity at the right lentiform nucleus, with additional possible 7 mm hypodensity at the contralateral left lentiform nucleus. Findings are age indeterminate, but could reflect a small acute ischemic infarcts. 4/17 repeat CT head done due to inability to wake up and it shows bilateral basal ganglia hypodensity which is somewhat larger than prior exam. Concern for evolving infarct or metabolic process.  ?-MRI showing symmetric diffusion restriction and peripheral contrast enhancement within the globi pallidi; likely associated with toxin/drug exposure. ?P: ?-neuro following; appreciate recs ?-frequent neuro checks ?-neuroprotective measures: normothermia, normocapnea, normoglycemia, HOB >30 degrees, neck midline ? ?Acute Encephalopathy: on versed IV 4/14-4-/19 ?Drug overdose: UDS positive for opiates, amphetamines, and benzos ?Hx of polysubstance abuse ?Hx alcohol use: unknown amount used ?P: ?-stop precedex this am for daily wake up assessment ?-limit sedating meds for RASS 0 ?-neuro checks ?-high dose thiamine to prophylactic treat possible wernicke's encephalopathy ?-cont mvi/folate ? ?Acute respiratory failure due to above ?P: ?-cont mech vent PRVC 6-8 cc/kg ?-wean fio2 for sats >92% ?-VAP prevention in place ?-Daily SAT and SBT ? ?Sepsis ?Aspiration pneumonia ?1/2 staph epi likely contaminant ?P: ?-PCT <0.10; consider dc'ing meropenem ?-follow sputum  culture ?-mrsa pcr negative; dc vanc ?-continue to follow bcx2 and uc at outside hospital ? ?Macupapular rash: b/l arm pits, b/l groin, underneath breast ?P: ?-appears to be fungal ?-nystatin cream ordered ?-vanc dc today ?-continue to monitor rash for improvement; consider dc'ing meropenem ? ?Vomiting ?Constipation ?P: ?-bowel regimen ordered ?-consider starting trickle TF later today if no more vomiting this am ?-monitor for bowel mvt ? ?Hypokalemia ?P: ?-trend bmp/mag ?-replete electrolytes as needed ? ?Anemia ?P: ?-trend cbc ? ?Hx of MDD ?P: ?-no home meds listed ? ?Best Practice (right click and "Reselect all SmartList Selections" daily)  ? ?Diet/type: NPO w/ meds via tube ?DVT prophylaxis: SCD ?GI prophylaxis: PPI ?Lines: N/A ?Foley:  N/A ?Code Status:  full code ?Last date of multidisciplinary goals of care discussion [pending] ? ?Labs   ?CBC: ?Recent Labs  ?Lab 07/28/21 ?1434 07/28/21 ?2002  ?WBC  --  5.8  ?HGB 8.5* 9.6*  ?HCT 25.0* 29.0*  ?MCV  --  94.2  ?PLT  --  174  ? ? ?Basic Metabolic Panel: ?Recent Labs  ?Lab 07/28/21 ?1434 07/28/21 ?1451 07/28/21 ?2004 07/29/21 ?0310  ?NA 144 144 141 139  ?K 3.7 3.2* 4.4 4.1  ?CL  --  118* 114* 110  ?CO2  --  20* 21* 22  ?GLUCOSE  --  94 131* 135*  ?BUN  --  7 7 8   ?CREATININE  --  0.50 0.60 0.53  ?CALCIUM  --  7.4* 8.1* 8.4*  ?MG  --  1.5*  --  2.1  ?PHOS  --  1.8*  --  2.2*  ? ?GFR: ?  Estimated Creatinine Clearance: 114.9 mL/min (by C-G formula based on SCr of 0.53 mg/dL). ?Recent Labs  ?Lab 07/28/21 ?1451 07/28/21 ?2002  ?PROCALCITON <0.10  --   ?WBC  --  5.8  ? ? ?Liver Function Tests: ?Recent Labs  ?Lab 07/29/21 ?0310  ?AST 19  ?ALT 17  ?ALKPHOS 48  ?BILITOT 0.5  ?PROT 6.1*  ?ALBUMIN 2.5*  ? ?No results for input(s): LIPASE, AMYLASE in the last 168 hours. ?No results for input(s): AMMONIA in the last 168 hours. ? ?ABG ?   ?Component Value Date/Time  ? PHART 7.447 07/28/2021 1434  ? PCO2ART 34.3 07/28/2021 1434  ? PO2ART 237 (H) 07/28/2021 1434  ? HCO3 23.8  07/28/2021 1434  ? TCO2 25 07/28/2021 1434  ? O2SAT 100 07/28/2021 1434  ?  ? ?Coagulation Profile: ?No results for input(s): INR, PROTIME in the last 168 hours. ? ?Cardiac Enzymes: ?No results for input(s): CKTOTAL, CKMB,

## 2021-07-29 NOTE — Progress Notes (Signed)
eLink Physician-Brief Progress Note ?Patient Name: Angela Mathews ?DOB: 1985/10/06 ?MRN: [id number] ? ? ?Date of Service ? 07/29/2021  ?HPI/Events of Note ? CT abdomen  / pelvis without evidence of bowel obstruction, radiologist concerned about possible mild stranding of lower right kidney, and wall thickening of the gall bladder, to suggest possible pyelonephritis / cholecystitis, patient also has bilateral lower lobe lung consolidation which is not a new finding. She is already on Vancomycin + Meropenem.   ?eICU Interventions ? Will send a urinalysis, CMP, and order a right upper quadrant ultrasound, general surgery will be consulted if RUQ ultrasound confirms cholecystitis.  ? ? ? ?  ? ?Thomasene Lot Angela Mathews ?07/29/2021, 2:10 AM ?

## 2021-07-29 NOTE — Progress Notes (Signed)
eLink Physician-Brief Progress Note ?Patient Name: Angela Mathews ?DOB: 05/03/1985 ?MRN: [id number] ? ? ?Date of Service ? 07/29/2021  ?HPI/Events of Note ? KUB report and images reviewed.  ?eICU Interventions ? CT abdomen / pelvis without contrast ordered to r/o bowel obstruction.  ? ? ? ?  ? ?Angela Mathews ?07/29/2021, 12:50 AM ?

## 2021-07-29 NOTE — Progress Notes (Signed)
Pharmacy Phenobarbital Consult Note  ? ?Pharmacy Consult for IV Phenobarbital   ?Indication: Alcohol Withdrawal   ? ?Labs:  ?Lab Results  ?Component Value Date  ? CREATININE 0.53 07/29/2021  ? AST 19 07/29/2021  ? ALT 17 07/29/2021  ? ALKPHOS 48 07/29/2021  ? ? ?Nursing Bedside Screening:   ?AUDIT-C: 7 ?PAWSS: 4 ?Last known drink: unknown  ?Bedside assessment completed by Tyron Russell on 07/29/21 ? ?Assessment: ?Angela Mathews is a 36 y.o. year old female admitted on 07/28/2021. Patients meets criteria for high risk dosing of phenobarbital. Patient failed swallow evaluation and IV needs medications. Pharmacy consulted to dose phenobarbital.  ? ?Plan: ?Start high risk taper IV: Phenobarbital 97.5mg  IV push q 8h x 6 doses followed by Phenobarbital 65mg  IV push q 8h x 6 doses followed by Phenobarbital 32.4mg  IV push q 8h x 6 doses ?Start Lorazepam 1mg  IV q4h prn agitation    ? ?Thank you for allowing pharmacy to participate in this patient's care. ? ? ?07/29/2021,6:11 PM ?  ?

## 2021-07-29 NOTE — Evaluation (Signed)
Clinical/Bedside Swallow Evaluation ?Patient Details  ?Name: Angela Mathews ?MRN: [id number] ?Date of Birth: 31-Aug-1985 ? ?Today's Date: 07/29/2021 ?Time: SLP Start Time (ACUTE ONLY): 1551 SLP Stop Time (ACUTE ONLY): 1603 ?SLP Time Calculation (min) (ACUTE ONLY): 12 min ? ?Past Medical History: No past medical history on file. ?Past Surgical History: No past surgical history on file. ?HPI:  ?Pt is a 36 y/o female who presented to [facility] ED on 4/14 with AMS and was subsequently tranferred to [facility]. Pt was found by EMS with drugs and needles lying next to her, pupils pinpoint, and was unresponsive with minimal response to Narcan. UDS positive for amphetamines, opiates, and benzos. CXR on admission to [facility] showed multilobar bilateral bronchopneumoni concerning for aspiration pneumonia. EEG 4/20: moderate diffuse encephalopathy, nonspecific etiology; no seizure. MRI 4/19: Symmetric diffusion restriction and peripheral contrast enhancement within the globi pallidi. This pattern is associated with toxin/drug exposure, including THC. Hypoxic ischemic injury also considered. Pt intubated for airway protection. ETT 4/14-4/20.  ?  ?Assessment / Plan / Recommendation  ?Clinical Impression ? Pt was seen for bedside swallow evaluation and she denied a history of dysphagia. [name] alertness was suboptimal, but adequate for p.o. intake. Oral mechanism exam was limited due to pt's difficulty following commands; however, oral motor strength and ROM appeared grossly WFL and dentition was reduced. Vocal quality was hoarse and vocal intensity reduced, suggesting vocal fold insufficiency in the setting of prolonged intubation. She demonstrated symptoms oropharyngeal dysphagia characterized by reduced bolus awareness, oral holding, multiple swallows, and signs of aspiration with puree, thin liquids, and nectar thick liquids. Coughing was lessened, but not eliminated, with reduced half-tsp boluses of puree. It is recommended that pt's  NPO status be maintained, but very critical meds may be crushed and given in 1/2 tsp boluses of puree. If adequately alert, pt may have small individual ice chips after oral care. SLP will follow to assess improvement in swallow function and for instrumental assessment as clinically indicated. ?SLP Visit Diagnosis: Dysphagia, unspecified (R13.10) ?   ?Aspiration Risk ? Moderate aspiration risk  ?  ?Diet Recommendation NPO;Alternative means - temporary  ? ?Medication Administration: Via alternative means (very critical meds may be crushed and given in 1/2 tsp boluses of puree)  ?  ?Other  Recommendations Oral Care Recommendations: Oral care QID;Staff/trained caregiver to provide oral care   ? ?Recommendations for follow up therapy are one component of a multi-disciplinary discharge planning process, led by the attending physician.  Recommendations may be updated based on patient status, additional functional criteria and insurance authorization. ? ?Follow up Recommendations  (TBD)  ? ? ?  ?Assistance Recommended at Discharge Frequent or constant Supervision/Assistance  ?Functional Status Assessment Patient has had a recent decline in their functional status and demonstrates the ability to make significant improvements in function in a reasonable and predictable amount of time.  ?Frequency and Duration min 2x/week  ?2 weeks ?  ?   ? ?Prognosis Prognosis for Safe Diet Advancement: Good ?Barriers to Reach Goals: Cognitive deficits;Severity of deficits  ? ?  ? ?Swallow Study   ?General Date of Onset: 07/29/21 ?HPI: Pt is a 36 y/o female who presented to [facility] ED on 4/14 with AMS and was subsequently tranferred to [facility]. Pt was found by EMS with drugs and needles lying next to her, pupils pinpoint, and was unresponsive with minimal response to Narcan. UDS positive for amphetamines, opiates, and benzos. CXR on admission to [facility] showed multilobar bilateral bronchopneumoni concerning for aspiration pneumonia. EEG  4/20:  moderate diffuse encephalopathy, nonspecific etiology; no seizure. MRI 4/19: Symmetric diffusion restriction and peripheral contrast enhancement within the globi pallidi. This pattern is associated with toxin/drug exposure, including THC. Hypoxic ischemic injury also considered. Pt intubated for airway protection. ETT 4/14-4/20. ?Type of Study: Bedside Swallow Evaluation ?Previous Swallow Assessment: none ?Diet Prior to this Study: NPO ?Temperature Spikes Noted: No ?Respiratory Status: Nasal cannula ?History of Recent Intubation: Yes ?Length of Intubations (days): 6 days ?Date extubated: 07/29/21 ?Behavior/Cognition: Alert;Cooperative;Pleasant mood;Confused;Doesn't follow directions;Requires cueing ?Oral Cavity Assessment: Within Functional Limits ?Oral Care Completed by SLP: No ?Oral Cavity - Dentition: Missing dentition ?Self-Feeding Abilities: Total assist ?Patient Positioning: Upright in bed;Postural control adequate for testing ?Baseline Vocal Quality: Hoarse;Low vocal intensity ?Volitional Cough: Weak ?Volitional Swallow: Able to elicit  ?  ?Oral/Motor/Sensory Function Overall Oral Motor/Sensory Function: Within functional limits   ?Ice Chips Ice chips: Impaired ?Presentation: Spoon ?Oral Phase Functional Implications: Prolonged oral transit   ?Thin Liquid Thin Liquid: Impaired ?Presentation: Cup;Straw ?Pharyngeal  Phase Impairments: Multiple swallows;Cough - Immediate;Cough - Delayed  ?  ?Nectar Thick Nectar Thick Liquid: Impaired ?Presentation: Straw ?Oral Phase Impairments: Poor awareness of bolus ?Oral phase functional implications: Oral holding ?Pharyngeal Phase Impairments: Cough - Immediate;Cough - Delayed;Multiple swallows   ?Honey Thick Honey Thick Liquid: Not tested   ?Puree Puree: Impaired ?Presentation: Spoon ?Oral Phase Impairments: Poor awareness of bolus ?Oral Phase Functional Implications: Oral holding ?Pharyngeal Phase Impairments: Multiple swallows;Cough - Immediate   ?Solid ? ? ?  Solid: Not  tested  ? ?  ?Josealfredo Adkins I. Vear Clock, MS, CCC-SLP ?Acute Rehabilitation Services ?Office number [phone] ?Pager [phone] ? ?Scheryl Marten ?07/29/2021,4:19 PM ? ? ? ? ? ? ?

## 2021-07-30 ENCOUNTER — Inpatient Hospital Stay (HOSPITAL_COMMUNITY): Payer: Self-pay

## 2021-07-30 ENCOUNTER — Encounter (HOSPITAL_COMMUNITY): Payer: Self-pay | Admitting: Pulmonary Disease

## 2021-07-30 ENCOUNTER — Other Ambulatory Visit: Payer: Self-pay

## 2021-07-30 DIAGNOSIS — T50901A Poisoning by unspecified drugs, medicaments and biological substances, accidental (unintentional), initial encounter: Secondary | ICD-10-CM

## 2021-07-30 LAB — GLUCOSE, CAPILLARY
Glucose-Capillary: 77 mg/dL (ref 70–99)
Glucose-Capillary: 96 mg/dL (ref 70–99)
Glucose-Capillary: 112 mg/dL — ABNORMAL HIGH (ref 70–99)
Glucose-Capillary: 132 mg/dL — ABNORMAL HIGH (ref 70–99)

## 2021-07-30 MED ORDER — ZOLPIDEM TARTRATE 5 MG PO TABS
5.0000 mg | ORAL_TABLET | Freq: Every evening | ORAL | Status: DC | PRN
  Administered 2021-07-30 – 2021-07-31 (×2): 5 mg via ORAL
  Filled 2021-07-30 (×2): qty 1

## 2021-07-30 MED ORDER — THIAMINE HCL 100 MG PO TABS: 100.0000 mg | ORAL_TABLET | Freq: Every day | ORAL | Status: DC

## 2021-07-30 MED ORDER — ACETAMINOPHEN 325 MG PO TABS: 650.0000 mg | ORAL_TABLET | Freq: Four times a day (QID) | ORAL | Status: DC | PRN

## 2021-07-30 MED ORDER — LORAZEPAM 1 MG PO TABS: 1.0000 mg | ORAL_TABLET | Freq: Three times a day (TID) | ORAL | Status: DC | PRN

## 2021-07-30 MED ORDER — LORAZEPAM 2 MG/ML IJ SOLN: 1.0000 mg | Freq: Three times a day (TID) | INTRAMUSCULAR | Status: DC | PRN

## 2021-07-30 MED ORDER — ADULT MULTIVITAMIN W/MINERALS CH
1.0000 | ORAL_TABLET | Freq: Every day | ORAL | Status: DC
  Administered 2021-07-31 – 2021-08-01 (×2): 1 via ORAL
  Filled 2021-07-30 (×2): qty 1

## 2021-07-30 MED ORDER — FOLIC ACID 1 MG PO TABS
1.0000 mg | ORAL_TABLET | Freq: Every day | ORAL | Status: DC
Start: 2021-07-30 — End: 2021-08-01
  Administered 2021-07-31 – 2021-08-01 (×2): 1 mg via ORAL
  Filled 2021-07-30 (×2): qty 1

## 2021-07-30 MED ORDER — THIAMINE HCL 100 MG PO TABS
100.0000 mg | ORAL_TABLET | Freq: Every day | ORAL | Status: DC
Start: 2021-07-30 — End: 2021-08-01
  Administered 2021-07-31 – 2021-08-01 (×2): 100 mg via ORAL
  Filled 2021-07-30 (×2): qty 1

## 2021-07-30 MED ORDER — INSULIN ASPART 100 UNIT/ML IJ SOLN: 2.0000 [IU] | INTRAMUSCULAR | Status: DC

## 2021-07-30 MED ORDER — LORAZEPAM 2 MG/ML IJ SOLN: 1.0000 mg | INTRAMUSCULAR | Status: DC | PRN

## 2021-07-30 NOTE — TOC Initial Note (Signed)
Transition of Care (TOC) - Initial/Assessment Note  ? ? ?Patient Details  ?Name: Angela Mathews ?MRN: [id number] ?Date of Birth: Feb 24, 1986 ? ?Transition of Care (TOC) CM/SW Contact:    ?Lockie Pares, RN ?Phone Number: ?07/30/2021, 3:44 PM ? ?Clinical Narrative:                 ? ?36 year old found unresponsive with needles and syringes near her, gave 2 narcan. Intubated. Extubated 4/20.MRI brain with diffusion restriction Possibly hypoxic injury.  ?Patient have NOK as parents in [place], [place]. She lives there also.  Patient being worked up for [employer]. (CIR) ?CM will follow, patient is uninsured. May need Medication assistance.  ? ? ?Expected Discharge Plan: IP Rehab Facility ?Barriers to Discharge: Continued Medical Work up ? ? ?Patient Goals and CMS Choice ?  ?  ?  ? ?Expected Discharge Plan and Services ?Expected Discharge Plan: IP Rehab Facility ?  ?  ?  ?Living arrangements for the past 2 months: Apartment ?                ?  ?  ?  ?  ?  ?  ?  ?  ?  ?  ? ?Prior Living Arrangements/Services ?Living arrangements for the past 2 months: Apartment ?  ?Patient language and need for interpreter reviewed:: Yes ?       ?Need for Family Participation in Patient Care: Yes (Comment) ?Care giver support system in place?: Yes (comment) ?  ?Criminal Activity/Legal Involvement Pertinent to Current Situation/Hospitalization: No - Comment as needed ? ?Activities of Daily Living ?  ?  ? ?Permission Sought/Granted ?  ?  ?   ?   ?   ?   ? ?Emotional Assessment ?  ?  ?  ?Orientation: : Fluctuating Orientation (Suspected and/or reported Sundowners) ?Alcohol / Substance Use: Illicit Drugs ?Psych Involvement: No (comment) ? ?Admission diagnosis:  Drug overdose [T50.901A] ?Patient Active Problem List  ? Diagnosis Date Noted  ? Overdose of undetermined intent   ? Acute encephalopathy   ? Acute respiratory failure (HCC)   ? Aspiration pneumonia (HCC)   ? Drug overdose 07/28/2021  ? Goals of care, counseling/discussion   ? ?PCP:   Patient, No Pcp Per (Inactive) ?Pharmacy:   ?[facility] [address] [place] - [address] ?[address] ?[place] [address] ?Phone: [phone] Fax: [phone] ? ? ? ? ?Social Determinants of Health (SDOH) Interventions ?  ? ?Readmission Risk Interventions ?   ? View : No data to display.  ?  ?  ?  ? ? ? ?

## 2021-07-30 NOTE — Progress Notes (Addendum)
Neurology Progress Note ? ? ?S:// ?Extubated yesterday ?No new complaints ? ? ?O:// ?Current vital signs: ?BP 129/69   Pulse 97   Temp 98 ?F (36.7 ?C) (Oral)   Resp 14   Ht _0  (1.626 m)   Wt 106.8 kg   SpO2 94%   BMI 40.42 kg/m?  ?Vital signs in last 24 hours: ?Temp:  [97.9 ?F (36.6 ?C)-98.8 ?F (37.1 ?C)] 98 ?F (36.7 ?C) (04/21 0730) ?Pulse Rate:  [63-139] 97 (04/21 0430) ?Resp:  [14-32] 14 (04/21 0800) ?BP: (91-142)/(47-107) 129/69 (04/21 0800) ?SpO2:  [93 %-100 %] 94 % (04/21 0600) ?Weight:  [106.8 kg] 106.8 kg (04/21 0432) ?General: Extubated yesterday, awake alert in no distress ?HEENT: Normocephalic atraumatic ?Lungs: Clear ?Cardiovascular: Regular rhythm ?Extremities warm well perfused ?Neurological exam ?Awake alert oriented x2 ?Speech is clear but she has a mild amount of either dysarthria or developmental speech delay. ?No aphasia ?Diminished attention concentration ?Cranial nerves II to XII intact ?Motor examination with antigravity strength in all 4 extremities with bilateral upper extremities 4/5 at shoulder and 4 -/5 at the elbow and 3/5 at the wrists. ?Sensation intact to light touch ?Coordination with no dysmetria ? ? ? ?Medications ? ?Current Facility-Administered Medications:  ?  0.9 %  sodium chloride infusion, , Intravenous, PRN, Rigoberto Noel, MD, Paused at 07/28/21 1946 ?  acetaminophen (TYLENOL) tablet 650 mg, 650 mg, Per Tube, Q6H PRN, Mick Sell, PA-C ?  bisacodyl (DULCOLAX) suppository 10 mg, 10 mg, Rectal, Once, Andres Labrum D, PA-C ?  chlorhexidine gluconate (MEDLINE KIT) (PERIDEX) 0.12 % solution 15 mL, 15 mL, Mouth Rinse, BID, Rigoberto Noel, MD, 15 mL at 07/30/21 0160 ?  Chlorhexidine Gluconate Cloth 2 % PADS 6 each, 6 each, Topical, Daily, Rigoberto Noel, MD, 6 each at 07/29/21 2137 ?  enoxaparin (LOVENOX) injection 52.5 mg, 0.5 mg/kg, Subcutaneous, Q24H, Mick Sell, PA-C, 52.5 mg at 07/29/21 1405 ?  folic acid (FOLVITE) tablet 1 mg, 1 mg, Per Tube, Daily, Mick Sell, PA-C, 1 mg at 07/29/21 1010 ?  LORazepam (ATIVAN) injection 1 mg, 1 mg, Intravenous, Q4H PRN, Rigoberto Noel, MD, 1 mg at 07/30/21 0142 ?  MEDLINE mouth rinse, 15 mL, Mouth Rinse, 10 times per day, Rigoberto Noel, MD, 15 mL at 07/30/21 0031 ?  meropenem (MERREM) 1 g in sodium chloride 0.9 % 100 mL IVPB, 1 g, Intravenous, Q8H, [place], [place], [place], Last Rate: 200 mL/hr at 07/30/21 0700, Infusion Verify at 07/30/21 0700 ?  multivitamin with minerals tablet 1 tablet, 1 tablet, Per Tube, Daily, Mick Sell, PA-C, 1 tablet at 07/29/21 1009 ?  nystatin cream (MYCOSTATIN), , Topical, TID, Mick Sell, PA-C, Given at 07/29/21 2137 ?  PHENObarbital (LUMINAL) injection 97.5 mg, 97.5 mg, Intravenous, Q8H, 97.5 mg at 07/30/21 0420 **FOLLOWED BY** [START ON 07/31/2021] PHENObarbital (LUMINAL) injection 65 mg, 65 mg, Intravenous, Q8H **FOLLOWED BY** [START ON 08/02/2021] PHENObarbital (LUMINAL) injection 32.5 mg, 32.5 mg, Intravenous, Q8H, Levonne Spiller, RPH ?  senna (SENOKOT) tablet 8.6 mg, 1 tablet, Per Tube, Daily, Mick Sell, PA-C, 8.6 mg at 07/29/21 1010 ?  sodium chloride flush (NS) 0.9 % injection 10-40 mL, 10-40 mL, Intracatheter, Q12H, Rigoberto Noel, MD, 10 mL at 07/29/21 2137 ?  sodium chloride flush (NS) 0.9 % injection 10-40 mL, 10-40 mL, Intracatheter, PRN, Rigoberto Noel, MD ?  thiamine (B-1) 250 mg in sodium chloride 0.9 % 50 mL IVPB, 250 mg, Intravenous, BID,  Mick Sell, PA-C, Last Rate: 100 mL/hr at 07/30/21 2458, 250 mg at 07/30/21 0998 ?  thiamine (B-1) 250 mg in sodium chloride 0.9 % 50 mL IVPB, 250 mg, Intravenous, Daily, Mick Sell, PA-C ?  [START ON 08/02/2021] thiamine (B-1) injection 100 mg, 100 mg, Intravenous, Daily, Mick Sell, PA-C ? ?Labs ?CBC ?   ?Component Value Date/Time  ? WBC 5.6 07/29/2021 1155  ? RBC 3.05 (L) 07/29/2021 1155  ? HGB 9.3 (L) 07/29/2021 1155  ? HCT 28.5 (L) 07/29/2021 1155  ? PLT 195 07/29/2021 1155  ? MCV 93.4 07/29/2021 1155  ? MCH 30.5 07/29/2021 1155  ?  MCHC 32.6 07/29/2021 1155  ? RDW 14.4 07/29/2021 1155  ? LYMPHSABS 1.5 10/30/2017 0726  ? MONOABS 0.5 10/30/2017 0726  ? EOSABS 0.1 10/30/2017 0726  ? BASOSABS 0.0 10/30/2017 0726  ? ? ?CMP  ?   ?Component Value Date/Time  ? NA 139 07/29/2021 0310  ? K 4.1 07/29/2021 0310  ? CL 110 07/29/2021 0310  ? CO2 22 07/29/2021 0310  ? GLUCOSE 135 (H) 07/29/2021 0310  ? BUN 8 07/29/2021 0310  ? CREATININE 0.53 07/29/2021 0310  ? CALCIUM 8.4 (L) 07/29/2021 0310  ? PROT 6.1 (L) 07/29/2021 0310  ? ALBUMIN 2.5 (L) 07/29/2021 0310  ? AST 19 07/29/2021 0310  ? ALT 17 07/29/2021 0310  ? ALKPHOS 48 07/29/2021 0310  ? BILITOT 0.5 07/29/2021 0310  ? GFRNONAA >60 07/29/2021 0310  ? GFRAA >60 10/30/2017 0726  ? ? ? ?Imaging ?I have reviewed the images obtained: ?  ?CT-head reviewed in PACS ?07/26/2021 motion degraded-hypodensity noted in bilateral basal ganglia which appears somewhat larger than prior exam and somewhat symmetric.  May represent evolving infarcts-a metabolic process such as carbon monoxide also could have similar appearance. ? ?MRI of the brain with bilateral globus pallidus hyperintensities suggestive of hypoxic injury versus more likely injury related to illicit drug use/toxin exposure including THC. ? ?MR angiography of the head: Normal. ?  ?Assessment:  ?36 year old transfer from outside hospital for altered mental status and CT concerning for bilateral basal ganglia abnormalities.  MRI confirms them to be DWI flair lesions that are consistent with drug use/toxin exposure. ?Urinary toxicology screen was positive for multiple substances. ?After reducing the high amounts of benzodiazepine-Versed drip that she was on, she has come around recently and was extubated yesterday. ?Has a nonfocal exam today ?Do not think she needs any further neurological intervention at this time ?If her upper extremity weakness, which I think is because of the bilateral basal ganglia involvement bordering the internal capsular fibers, does  not improve in the next 4 to 6 weeks, she should get further brain and C-spine imaging as well as consideration for EMG nerve conduction studies. ? ? ?Impression ?Toxic metabolic encephalopathy-likely secondary to drug use ?MRI changes not consistent with stroke ? ? ?Recommendations: ?Counseled on cessation of drug use ?Will image her C-spine-given that she was found down under unclear circumstances and still continues to have bilateral symmetric distal limb weakness. ?Plan was discussed with Dr. Elsworth Soho ? ? ?Addendum ?- Did not tolerate MRI of the C-spine ?- CT C-spine was ordered-no fracture. ?Plan as above. ?Neurology will sign off-please call with questions. ?-- ?Amie Portland, MD ?Neurologist ?[facility] ?Pager: [phone] ? ?

## 2021-07-30 NOTE — Progress Notes (Signed)
eLink Physician-Brief Progress Note ?Patient Name: Angela Mathews ?DOB: 04-25-1985 ?MRN: [id number] ? ? ?Date of Service ? 07/30/2021  ?HPI/Events of Note ? Bedside RN asking for CBG + SSI to be changed from Q 4 hours to Q AC and HS.  ?eICU Interventions ? Protocol changed to Q AC & HS.  ? ? ? ?  ? ?Migdalia Dk ?07/30/2021, 9:33 PM ?

## 2021-07-30 NOTE — Evaluation (Signed)
Physical Therapy Evaluation ?Patient Details ?Name: Angela Mathews ?MRN: [id number] ?DOB: September 26, 1985 ?Today's Date: 07/30/2021 ? ?History of Present Illness ? Pt adm to [facility] on 4/14 for drug overdose. Pt intubated on 4/14. Pt transferred to [facility] on 4/19 for further work up for possible basal ganglia stroke. Neurology concluded the lesions seen on MRI/CT are consistent with drug use/toxin exposure. Pt extubated 4/20. PMH - polysubstance abuse  ?Clinical Impression ? Pt presents to PT with significant decr in mobility due to decr cognition, decr strength, and decr balance. At current level recommend acute inpatient rehab however if pt progresses rapidly she may be able to return straight home.  ?   ? ?Recommendations for follow up therapy are one component of a multi-disciplinary discharge planning process, led by the attending physician.  Recommendations may be updated based on patient status, additional functional criteria and insurance authorization. ? ?Follow Up Recommendations Acute inpatient rehab (3hours/day) ? ?  ?Assistance Recommended at Discharge Frequent or constant Supervision/Assistance  ?Patient can return home with the following ? Two people to help with walking and/or transfers;A lot of help with bathing/dressing/bathroom;Help with stairs or ramp for entrance;Assist for transportation;Direct supervision/assist for medications management;Direct supervision/assist for financial management;Assistance with cooking/housework ? ?  ?Equipment Recommendations Rolling walker (2 wheels) (To be updated)  ?Recommendations for Other Services ? Rehab consult  ?  ?Functional Status Assessment Patient has had a recent decline in their functional status and demonstrates the ability to make significant improvements in function in a reasonable and predictable amount of time.  ? ?  ?Precautions / Restrictions Precautions ?Precautions: Fall  ? ?  ? ?Mobility ? Bed Mobility ?Overal bed mobility: Needs  Assistance ?Bed Mobility: Supine to Sit ?  ?  ?Supine to sit: Min assist, HOB elevated ?  ?  ?General bed mobility comments: Assist to elevate trunk into sitting ?  ? ?Transfers ?Overall transfer level: Needs assistance ?Equipment used: 2 person hand held assist, Rolling walker (2 wheels) ?Transfers: Sit to/from Stand, Bed to chair/wheelchair/BSC ?Sit to Stand: +2 physical assistance, Min assist ?  ?Step pivot transfers: +2 physical assistance, Mod assist ?  ?  ?  ?General transfer comment: Assist to bring hips up and for balance coming to stand from bed with +2 hand held assist. Pt with wide based pivotal steps to recliner with pt beginning to sit before turned all the way to the recliner. Pt stood from recliner with walker and +2 min assist. ?  ? ?Ambulation/Gait ?  ?  ?  ?  ?  ?  ?Pre-gait activities: Stood with walker x 20 sec with +2 min assist. Fatigues quickly. ?  ? ?Stairs ?  ?  ?  ?  ?  ? ?Wheelchair Mobility ?  ? ?Modified Rankin (Stroke Patients Only) ?  ? ?  ? ?Balance Overall balance assessment: Needs assistance ?Sitting-balance support: Bilateral upper extremity supported, Feet supported ?Sitting balance-Leahy Scale: Poor ?Sitting balance - Comments: UE support ?  ?Standing balance support: Bilateral upper extremity supported ?Standing balance-Leahy Scale: Poor ?Standing balance comment: BUE support and +2 min assist for static standing ?  ?  ?  ?  ?  ?  ?  ?  ?  ?  ?  ?   ? ? ? ?Pertinent Vitals/Pain Pain Assessment ?Pain Assessment: No/denies pain  ? ? ?Home Living Family/patient expects to be discharged to:: Private residence ?Living Arrangements: Parent ?Available Help at Discharge: Family;Available 24 hours/day ?  ?  ?  ?  ?  ?  Home Layout: One level ?Home Equipment: None ?   ?  ?Prior Function Prior Level of Function : Independent/Modified Independent;Driving ?  ?  ?  ?  ?  ?  ?  ?  ?  ? ? ?Hand Dominance  ?   ? ?  ?Extremity/Trunk Assessment  ? Upper Extremity Assessment ?Upper Extremity  Assessment: Defer to OT evaluation ?  ? ?Lower Extremity Assessment ?Lower Extremity Assessment: Generalized weakness ?  ? ?   ?Communication  ? Communication: No difficulties  ?Cognition Arousal/Alertness: Awake/alert ?Behavior During Therapy: Impulsive ?Overall Cognitive Status: Impaired/Different from baseline ?Area of Impairment: Attention, Memory, Following commands, Safety/judgement, Awareness, Problem solving ?  ?  ?  ?  ?  ?  ?  ?  ?  ?Current Attention Level: Sustained ?Memory: Decreased recall of precautions, Decreased short-term memory ?Following Commands: Follows one step commands consistently ?Safety/Judgement: Decreased awareness of safety, Decreased awareness of deficits ?Awareness: Intellectual ?Problem Solving: Requires verbal cues, Difficulty sequencing, Requires tactile cues ?  ?  ?  ? ?  ?General Comments   ? ?  ?Exercises    ? ?Assessment/Plan  ?  ?PT Assessment Patient needs continued PT services  ?PT Problem List Decreased strength;Decreased activity tolerance;Decreased balance;Decreased mobility;Decreased cognition;Decreased safety awareness ? ?   ?  ?PT Treatment Interventions DME instruction;Gait training;Stair training;Functional mobility training;Therapeutic activities;Therapeutic exercise;Balance training;Cognitive remediation;Patient/family education   ? ?PT Goals (Current goals can be found in the Care Plan section)  ?Acute Rehab PT Goals ?Patient Stated Goal: get up ?PT Goal Formulation: With patient/family ?Time For Goal Achievement: 08/13/21 ?Potential to Achieve Goals: Good ? ?  ?Frequency Min 3X/week ?  ? ? ?Co-evaluation   ?  ?  ?  ?  ? ? ?  ?AM-PAC PT "6 Clicks" Mobility  ?Outcome Measure Help needed turning from your back to your side while in a flat bed without using bedrails?: A Little ?Help needed moving from lying on your back to sitting on the side of a flat bed without using bedrails?: A Little ?Help needed moving to and from a bed to a chair (including a wheelchair)?:  Total ?Help needed standing up from a chair using your arms (e.g., wheelchair or bedside chair)?: Total ?Help needed to walk in hospital room?: Total ?Help needed climbing 3-5 steps with a railing? : Total ?6 Click Score: 10 ? ?  ?End of Session Equipment Utilized During Treatment: Gait belt ?Activity Tolerance: Patient tolerated treatment well ?Patient left: in chair;with call bell/phone within reach;with restraints reapplied;with family/visitor present ?Nurse Communication: Mobility status;Need for lift equipment (Stedy could be used if desired) ?PT Visit Diagnosis: Unsteadiness on feet (R26.81);Other abnormalities of gait and mobility (R26.89);Muscle weakness (generalized) (M62.81) ?  ? ?Time: [id number] ?PT Time Calculation (min) (ACUTE ONLY): 21 min ? ? ?Charges:   PT Evaluation ?$PT Eval Moderate Complexity: 1 Mod ?  ?  ?   ? ? ?[facility] PT ?Acute Rehabilitation Services ?Office [phone] ? ? ?Shary Decamp [facility] ?07/30/2021, 2:42 PM ? ?

## 2021-07-30 NOTE — Progress Notes (Signed)
Inpatient Rehab Admissions Coordinator Note:  ? ?Per PT recommendations patient was screened for CIR candidacy by Michel Santee, PT. At this time, pt appears to be a potential candidate for CIR. I will place an order for rehab consult for full assessment, per our protocol.  Please contact me any with questions.. ? ?Shann Medal, PT, DPT ?[phone] ?07/30/21 ?2:52 PM  ?

## 2021-07-30 NOTE — Progress Notes (Signed)
Modified Barium Swallow Progress Note ? ?Patient Details  ?Name: Angela Mathews ?MRN: [id number] ?Date of Birth: [birth year] ? ?Today's Date: 07/30/2021 ? ?Modified Barium Swallow completed.  Full report located under Chart Review in the Imaging Section. ? ?Brief recommendations include the following: ? ?Clinical Impression ? Pt was seen in radiology suite for inpatient MBS. Oral phase of swallowing unremarkable. Pt demonstrated adequate bolus preparation, mastication and propulsion across consistencies and textures. Initiation of the pharyngeal swallow sequence occurred at the level of the valleculae. As the pt swallowed consecutive sips of thin, aspirates entered the airway prior to complete epiglottic deflection, indicating a primary timing issue. Pt was able to resolve trace aspirates independently with a cough (PAS 6). SLP had the pt engage in individual sips of thin liquid and the pt only experienced flash penetration (PAS 2). NTL was also tested and pt continued to exhibit flash penetration (PAS 2). Penetrates and aspirates are sensed and ejected independently by the pt. Puree and regular textures were within functional limits. SLP recommends the pt begin regular diet with thin liquids. Pt educated on standard swallow precautions including upright positioning and small sips/bites. ?  ?Swallow Evaluation Recommendations ? ?   ? ? SLP Diet Recommendations: Regular solids;Thin liquid ? ? Liquid Administration via: Straw ? ? Medication Administration: Whole meds with liquid ? ? Supervision: Staff to assist with self feeding ? ? Compensations: Minimize environmental distractions;Slow rate;Small sips/bites ? ? Postural Changes: Seated upright at 90 degrees ? ? Oral Care Recommendations: Oral care BID ? ?   ? ? ? ?Ezekiel Slocumb ?07/30/2021,10:37 AM ?

## 2021-07-30 NOTE — Progress Notes (Addendum)
? ?NAME:  Angela Mathews, MRN:  [id number], DOB:  [birth year], LOS: 2 ?ADMISSION DATE:  07/28/2021, CONSULTATION DATE:  4/19 ?REFERRING MD:  Dr. Sharia Reeve , CHIEF COMPLAINT:  b/l basal ganglia stroke  ? ?History of Present Illness:  ?Patient is a 36 yo F w/ pertinent PMH of polysubstance abuse presents to [facility] ED on 4/14 with AMS. ? ?EMS found patient on 4/14 with drugs and needles lying next to her. Pupils pinpoint. Patient was unresponsive with minimal response to Narcan. She did have some emesis. Patient tachypneic and was being bagged and transported to [facility] ED on 4/14. There patient was given another dose of IV Narcan with some response to painful stimuli. Patient was intubated for airway protection. UDS positive for amphetamines, opiates, and benzos. CXR showing multilobar bilateral bronchopneumoni concerning for aspiration pneumonia. BC 1/2 showing staph epidermis (likely contaminate). Started on Vanc and zosyn. Elevated LA. Sepsis protocol initiated and given IV fluids. CT head on 4/14 showed 0.9 mm hypodensity at the right lentiform nucleus, with additional possible 7 mm hypodensity at the contralateral left lentiform nucleus. Findings are age indeterminate, but could reflect a small acute ischemic infarcts. ? ?Patient WBC remained elevated and febrile; abx broadened to IV Azactam, Flagyl and vancomycin. UA/UC negative. On 4/17 repeat CT head done due to inability to wake up and it shows bilateral basal ganglia hypodensity which is somewhat larger than prior exam. Concern for evolving infarct or metabolic process.  ? ?On 4/18, remains intubated, had fever spike of 101.3 earlier this morning. Aztreonam changed to Meropenem. ? ?4/19 patient being transported to [facility] for further workup for possible b/l basal ganglia stroke. ? ?Pertinent  Medical History  ?Polysubstance abuse  ?Alcohol use ? ?Significant Hospital Events: ?Including procedures, antibiotic start and stop dates in addition to other  pertinent events   ?4/14: admitted to [facility] ED for OD; intubated; abx started for aspiration pneumonia. CT head: showed 0.9 mm hypodensity at the right lentiform nucleus, with additional possible 7 mm hypodensity at the contralateral left lentiform nucleus. Findings are age indeterminate, but could reflect a small acute ischemic infarcts. ?4/17 repeat CT head done due to inability to wake up and it shows bilateral basal ganglia hypodensity which is somewhat larger than prior exam. Concern for evolving infarct or metabolic process. Recommend MRI. ?4/19: transferring to [facility] for further workup for possible b/l basal ganglia stroke; remains intubated. Was on Versed drip and switched to precedex. MRI showing symmetric diffusion restriction and peripheral contrast enhancement within the globi pallidi; likely associated with toxin/drug exposure. ?4/20: received Ct abd/pelvis and RUQ US showing chololithiasis without cholecystitis. ? ?Interim History / Subjective:  ? ?Extubated yesterday ?Did well from a breathing standpoint, did not clear swallow. ?Agitated overnight -pharmacy instituted phenobarbital protocol ?Complains of increased urine and diarrhea, now has pure wick ? ?Objective   ?Blood pressure 108/88, pulse 90, temperature 98 ?F (36.7 ?C), temperature source Oral, resp. rate 14, height 5\' 4"  (1.626 m), weight 106.8 kg, SpO2 94 %. ?   ?   ? ?Intake/Output Summary (Last 24 hours) at 07/30/2021 0912 ?Last data filed at 07/30/2021 0900 ?Gross per 24 hour  ?Intake 608.88 ml  ?Output 2725 ml  ?Net -2116.12 ml  ? ? ?Filed Weights  ? 07/28/21 1330 07/29/21 0500 07/30/21 0432  ?Weight: 106 kg 106.9 kg 106.8 kg  ? ? ?Examination: ? ?General: Young woman, lying in bed, restless moving around ?HEENT: MM pink/moist; no JVD ?Neuro: Alert, interactive, nonfocal, no tremors ?  CV: s1s2, no m/r/g ?PULM: No accessory muscle use, clear breath sounds bilateral, no rhonchi ?GI: soft, bsx4 active ?Extremities: warm/dry, no  edema ?Skin: macupapular rash in b/l arm pits, b/l groin, underneath breast ?Multiple tattoos ? ?Resolved Hospital Problem list   ?Hyponatremia ? ?Assessment & Plan:  ? ?Possible B/l basal ganglia hypodensity, favor hypoxic injury versus drug use rather than stroke: CT head on 4/14 showed 0.9 mm hypodensity at the right lentiform nucleus, with additional possible 7 mm hypodensity at the contralateral left lentiform nucleus. Findings are age indeterminate, but could reflect a small acute ischemic infarcts. 4/17 repeat CT head done due to inability to wake up and it shows bilateral basal ganglia hypodensity which is somewhat larger than prior exam. Concern for evolving infarct or metabolic process.  ?-MRI showing symmetric diffusion restriction and peripheral contrast enhancement within the globi pallidi; likely associated with toxin/drug exposure. ?-EEG negative ?P: ?-neuro following; appreciate recs ?-MRI C-spine ordered ? ?Acute Encephalopathy: on versed IV 4/14-4-/19 ?Drug overdose: UDS positive for opiates, amphetamines, and benzos ?Hx of polysubstance abuse ?Hx alcohol use: Not impressed with alcohol use history, no prior history of withdrawal ?P: ?-DC phenobarb protocol ?-Use Ativan as needed agitation or high CIWA ?-neuro checks ?-high dose thiamine to prophylactic treat possible wernicke's encephalopathy ?-cont mvi/folate ?-Consider low-dose methadone for opioid withdrawal ? ?Acute respiratory failure due to above ?P: ?-Resolved ? ?Sepsis ?Aspiration pneumonia ?1/2 staph epi likely contaminant ?P: ?-PCT <0.10; DC meropenem ? ?Macupapular rash: b/l arm pits, b/l groin, underneath breast ?P: ?-appears to be fungal ?-nystatin cream ordered ?-continue to monitor rash for improvement; consider dc'ing meropenem ? ? ?Morbid obesity, BMI more than 40 ?Vomiting ?Constipation ?P: ?-Received laxatives and now has diarrhea ?-Swallow evaluation and advance ? ? ?Anemia ?P: ?-trend cbc ? ?Can transfer to [place] and to  [facility] 4[facility] ?We will have to watch for symptoms of opiate withdrawal ?Updated mother Jackelyn Poling at bedside ? ?Best Practice (right click and "Reselect all SmartList Selections" daily)  ? ?Diet/type: NPO w/ oral meds ?DVT prophylaxis: SCD ?GI prophylaxis: PPI ?Lines: N/A ?Foley:  N/A ?Code Status:  full code ?Last date of multidisciplinary goals of care discussion [pending] ? ?Labs   ?CBC: ?Recent Labs  ?Lab 07/28/21 ?1434 07/28/21 ?2002 07/29/21 ?1155  ?WBC  --  5.8 5.6  ?HGB 8.5* 9.6* 9.3*  ?HCT 25.0* 29.0* 28.5*  ?MCV  --  94.2 93.4  ?PLT  --  174 195  ? ? ? ?Basic Metabolic Panel: ?Recent Labs  ?Lab 07/28/21 ?1434 07/28/21 ?1451 07/28/21 ?2004 07/29/21 ?0310  ?NA 144 144 141 139  ?K 3.7 3.2* 4.4 4.1  ?CL  --  118* 114* 110  ?CO2  --  20* 21* 22  ?GLUCOSE  --  94 131* 135*  ?BUN  --  7 7 8   ?CREATININE  --  0.50 0.60 0.53  ?CALCIUM  --  7.4* 8.1* 8.4*  ?MG  --  1.5*  --  2.1  ?PHOS  --  1.8*  --  2.2*  ? ? ?GFR: ?Estimated Creatinine Clearance: 114.8 mL/min (by C-G formula based on SCr of 0.53 mg/dL). ?Recent Labs  ?Lab 07/28/21 ?1451 07/28/21 ?2002 07/29/21 ?1155  ?PROCALCITON <0.10  --   --   ?WBC  --  5.8 5.6  ? ? ? ?Liver Function Tests: ?Recent Labs  ?Lab 07/29/21 ?0310  ?AST 19  ?ALT 17  ?ALKPHOS 48  ?BILITOT 0.5  ?PROT 6.1*  ?ALBUMIN 2.5*  ? ? ?No results for input(s):  LIPASE, AMYLASE in the last 168 hours. ?No results for input(s): AMMONIA in the last 168 hours. ? ?ABG ?   ?Component Value Date/Time  ? PHART 7.447 07/28/2021 1434  ? PCO2ART 34.3 07/28/2021 1434  ? PO2ART 237 (H) 07/28/2021 1434  ? HCO3 23.8 07/28/2021 1434  ? TCO2 25 07/28/2021 1434  ? O2SAT 100 07/28/2021 1434  ?  ? ?Coagulation Profile: ?No results for input(s): INR, PROTIME in the last 168 hours. ? ?Cardiac Enzymes: ?No results for input(s): CKTOTAL, CKMB, CKMBINDEX, TROPONINI in the last 168 hours. ? ?HbA1C: ?No results found for: HGBA1C ? ?CBG: ?Recent Labs  ?Lab 07/29/21 ?1144 07/29/21 ?1605 07/29/21 ?1954 07/29/21 ?2326 07/30/21 ?[id number]   ?GLUCAP 118* 109* 94 91 77  ? ? ? ?Critical care time: 31 minutes ?  ? ?Leanna Sato Elsworth Soho MD ?[facility] ?07/30/2021, 9:12 AM ? ?Please see [email or web address] for pager details. ? ?From 7A-7P if no respon

## 2021-07-30 NOTE — Progress Notes (Signed)
Speech Language Pathology Treatment: Dysphagia  ?Patient Details ?Name: Angela Mathews ?MRN: [id number] ?DOB: [birth year] ?Today's Date: 07/30/2021 ?Time: 7062-3762 ?SLP Time Calculation (min) (ACUTE ONLY): 15 min ? ?Assessment / Plan / Recommendation ?Clinical Impression ? Ms.Garbutt was seen at bedside for dysphagia treatment. Pt is making steady progress towards dysphagia goals. Pt was alert and cooperative throughout the entirety of the session. Pt was able to engage in simple conversation with clinicians and communicate wants/needs effectively. SLP presented thin liquid trials via straw cup to the pt. Observed immediate coughing and audible swallow with consecutive sips. Branched down to individual sips of thin, and pt had mild delayed cough. Puree and regular textures were favorable to the pt. No overt s/s of aspiration with puree and regular textures. Pt required hand-over-hand assist in order to engage in assisted feeds. MBS scheduled for later this date.  ?  ?HPI HPI: Pt is a 36 y/o female who presented to [facility] ED on 4/14 with AMS and was subsequently tranferred to [facility]. Pt was found by EMS with drugs and needles lying next to her, pupils pinpoint, and was unresponsive with minimal response to Narcan. UDS positive for amphetamines, opiates, and benzos. CXR on admission to [facility] showed multilobar bilateral bronchopneumoni concerning for aspiration pneumonia. EEG 4/20: moderate diffuse encephalopathy, nonspecific etiology; no seizure. MRI 4/19: Symmetric diffusion restriction and peripheral contrast enhancement within the globi pallidi. This pattern is associated with toxin/drug exposure, including THC. Hypoxic ischemic injury also considered. Pt intubated for airway protection. ETT 4/14-4/20. ?  ?   ?SLP Plan ? MBS ? ?  ?  ?Recommendations for follow up therapy are one component of a multi-disciplinary discharge planning process, led by the attending physician.  Recommendations may be updated based on  patient status, additional functional criteria and insurance authorization. ?  ? ?Recommendations  ?Medication Administration: Whole meds with liquid ?Supervision: Staff to assist with self feeding ?Compensations: Minimize environmental distractions ?Postural Changes and/or Swallow Maneuvers: Seated upright 90 degrees  ?   ?    ?   ? ? ? ? Oral Care Recommendations: Oral care QID;Staff/trained caregiver to provide oral care ?Follow Up Recommendations: Other (comment) (TBD) ?Assistance recommended at discharge: Frequent or constant Supervision/Assistance ?SLP Visit Diagnosis: Dysphagia, unspecified (R13.10) ?Plan: MBS ? ? ? ? ?  ?  ? ? ?Angela Mathews ? ?07/30/2021, 10:00 AM ?

## 2021-07-30 NOTE — Progress Notes (Signed)
Pt uncooperative and claustrophobic. Demanded to taken out of scanner. Pt sent back to floor. ?

## 2021-07-31 LAB — GLUCOSE, CAPILLARY
Glucose-Capillary: 120 mg/dL — ABNORMAL HIGH (ref 70–99)
Glucose-Capillary: 121 mg/dL — ABNORMAL HIGH (ref 70–99)
Glucose-Capillary: 122 mg/dL — ABNORMAL HIGH (ref 70–99)
Glucose-Capillary: 126 mg/dL — ABNORMAL HIGH (ref 70–99)

## 2021-07-31 LAB — CBC WITH DIFFERENTIAL/PLATELET
Abs Immature Granulocytes: 0.02 10*3/uL (ref 0.00–0.07)
Basophils Absolute: 0 10*3/uL (ref 0.0–0.1)
Basophils Relative: 1 %
Eosinophils Absolute: 0.4 10*3/uL (ref 0.0–0.5)
Eosinophils Relative: 7 %
HCT: 30.3 % — ABNORMAL LOW (ref 36.0–46.0)
Hemoglobin: 10.1 g/dL — ABNORMAL LOW (ref 12.0–15.0)
Immature Granulocytes: 0 %
Lymphocytes Relative: 30 %
Lymphs Abs: 1.8 10*3/uL (ref 0.7–4.0)
MCH: 30.6 pg (ref 26.0–34.0)
MCHC: 33.3 g/dL (ref 30.0–36.0)
MCV: 91.8 fL (ref 80.0–100.0)
Monocytes Absolute: 0.5 10*3/uL (ref 0.1–1.0)
Monocytes Relative: 8 %
Neutro Abs: 3.3 10*3/uL (ref 1.7–7.7)
Neutrophils Relative %: 54 %
Platelets: 248 10*3/uL (ref 150–400)
RBC: 3.3 MIL/uL — ABNORMAL LOW (ref 3.87–5.11)
RDW: 13.9 % (ref 11.5–15.5)
WBC: 6.1 10*3/uL (ref 4.0–10.5)
nRBC: 0 % (ref 0.0–0.2)

## 2021-07-31 LAB — BASIC METABOLIC PANEL
Anion gap: 6 (ref 5–15)
BUN: <5 mg/dL — ABNORMAL LOW (ref 6–20)
CO2: 28 mmol/L (ref 22–32)
Calcium: 8.6 mg/dL — ABNORMAL LOW (ref 8.9–10.3)
Chloride: 107 mmol/L (ref 98–111)
Creatinine, Ser: 0.54 mg/dL (ref 0.44–1.00)
GFR, Estimated: >60 mL/min (ref >60)
Glucose, Bld: 102 mg/dL — ABNORMAL HIGH (ref 70–99)
Potassium: 3.2 mmol/L — ABNORMAL LOW (ref 3.5–5.1)
Sodium: 141 mmol/L (ref 135–145)

## 2021-07-31 LAB — CULTURE, RESPIRATORY W GRAM STAIN: Culture: NO GROWTH

## 2021-07-31 LAB — HEMOGLOBIN A1C
Hgb A1c MFr Bld: 5.1 % (ref 4.8–5.6)
Mean Plasma Glucose: 99.67 mg/dL

## 2021-07-31 LAB — MAGNESIUM: Magnesium: 1.8 mg/dL (ref 1.7–2.4)

## 2021-07-31 LAB — PHOSPHORUS: Phosphorus: 3.3 mg/dL (ref 2.5–4.6)

## 2021-07-31 MED ORDER — HALOPERIDOL LACTATE 5 MG/ML IJ SOLN: 2.0000 mg | Freq: Four times a day (QID) | INTRAMUSCULAR | Status: DC | PRN

## 2021-07-31 MED ORDER — MAGNESIUM SULFATE IN D5W 1-5 GM/100ML-% IV SOLN
1.0000 g | Freq: Once | INTRAVENOUS | Status: AC
  Administered 2021-07-31: 1 g via INTRAVENOUS
  Filled 2021-07-31: qty 100

## 2021-07-31 MED ORDER — POTASSIUM CHLORIDE 20 MEQ PO PACK
40.0000 meq | PACK | Freq: Once | ORAL | Status: AC
Start: 2021-07-31 — End: 2021-07-31
  Administered 2021-07-31: 40 meq via ORAL
  Filled 2021-07-31: qty 2

## 2021-07-31 MED ORDER — POTASSIUM CHLORIDE CRYS ER 20 MEQ PO TBCR
40.0000 meq | EXTENDED_RELEASE_TABLET | Freq: Once | ORAL | Status: DC
  Filled 2021-07-31: qty 2

## 2021-07-31 MED ORDER — ENOXAPARIN SODIUM 60 MG/0.6ML IJ SOSY: 50.0000 mg | PREFILLED_SYRINGE | INTRAMUSCULAR | Status: DC

## 2021-07-31 MED ORDER — INSULIN ASPART 100 UNIT/ML IJ SOLN: 0.0000 [IU] | Freq: Three times a day (TID) | INTRAMUSCULAR | Status: DC

## 2021-07-31 MED ORDER — POTASSIUM CHLORIDE 10 MEQ/100ML IV SOLN
10.0000 meq | INTRAVENOUS | Status: AC
  Administered 2021-07-31 (×2): 10 meq via INTRAVENOUS
  Filled 2021-07-31 (×2): qty 100

## 2021-07-31 NOTE — Progress Notes (Signed)
?                                  PROGRESS NOTE                                             ?                                                                                                                     ?                                         ? ? Patient Demographics:  ? ? Angela Mathews, is a 36 y.o. female, DOB - [birth year], WSF:[id number] ? ?Outpatient Primary MD for the patient is Patient, No Pcp Per (Inactive)    LOS - 3  Admit date - 07/28/2021   ? ?No chief complaint on file. ?    ? ?Brief Narrative (HPI from H&P)   36 yo F w/ pertinent PMH of polysubstance abuse presents to [facility] ED on 4/14 with AMS. EMS found patient on 4/14 with drugs and needles lying next to her. Pupils pinpoint. She was bagged and transported to [facility] ED on 4/14. There patient was given another dose of IV Narcan with some response to painful stimuli. Patient was intubated for airway protection. UDS positive for amphetamines, opiates, and benzos. CXR showing multilobar bilateral bronchopneumoni concerning for aspiration pneumonia.  She was intubated and admitted to initially [facility] subsequently transferred to [facility] on 07/28/2020 for further care from [facility].  She was admitted to ICU at [facility] stabilized and transferred to hospitalist service on 07/31/2021 under my care.  At [facility] her work-up was consistent with some neurological injury as noted on MRI consistent with toxic injury from drug overdose, she was seen by neurology stroke was ruled out ? ? Subjective:  ? ? [name] today has, No headache, No chest pain, No abdominal pain - No Nausea, No new weakness tingling or numbness, no SOB. ? ? Assessment  & Plan :  ? ?Acute toxic encephalopathy with B/l basal ganglia hypodensity, due to recreational drug overdose: she seen by neurology stroke has been clinically ruled out, MRI brain and CT C-spine and EEG noted.  Per neurology  these changes are all consistent with drug overdose-related neurological injury, continue supportive care with PT OT.  She refuses placement.  Likely discharge with home health PT and DME on 08/01/2021 if stable, acute toxic encephalopathy much improved, will monitor with supportive care.  Last recreational drug use on 07/23/2021. ? ?Acute respiratory failure due to above and inability to protect airway.  Initially intubated now extubated and at baseline  on room air. ?  ?Sepsis due to aspiration pneumonia.  1 out of 2 blood cultures contaminated with staph epidermis.  Has finished antibiotic treatment in ICU.  Sepsis pathophysiology has completely resolved, cleared by speech. ? ?Macupapular rash: Fungal rash diagnosed in ICU, on nystatin cream.  Clinically improved ? ?Morbid obesity, BMI more than 40.  Follow with PCP for weight loss. ?  ?Anemia normocytic.   No need for transfusion.  Outpatient age-appropriate work-up by PCP. ? ? ?   ? ?Condition - Fair ? ?Family Communication  :  none present ? ?Code Status :  Full ? ?Consults  :  Neuro, PCCM ? ?PUD Prophylaxis :   ? ? Procedures  :    ? ?TTE ? ? ?MRI- A - 1. Symmetric diffusion restriction and peripheral contrast enhancement within the globi pallidi. This pattern is associated with toxin/drug exposure, including THC. Hypoxic ischemic injury is another consideration. 2. Normal intracranial MRA. ? ?CT C-spine.  Nonacute. ? ?EEG.  Nonacute ? ?CT abdomen pelvis.  1. Persistent dense consolidation in left-greater-than-right lower lobes with small pleural effusions, and air bronchograms. Findings may be seen with aspiration. There is no improvement from 07/23/2021. 2. Improvement in infrahilar posterior lingular consolidation. 3. Probable anemia. 4. Pericholecystic fluid, question slight gallbladder thickening with dense sludge or gravel in the lumen. Clinical correlation advised for cholecystitis. 5. Asymmetric stranding about the right kidney. This could be due to a  recently passed stone or pyelonephritis. There are nonobstructive caliceal stones in both kidneys. No intravesical filling defects. 6. Query mild wall thickening or nondistention ascending colon. No bowel obstruction or convincing inflammatory changes. 7. Mild body wall edema in the flanks and thighs. 8. Mild cardiomegaly. ? ?  ? ?   ? ?Disposition Plan  :   ? ?Status is: Inpatient ? ? ?DVT Prophylaxis  :  Lovenox ?  ? ?Lab Results  ?Component Value Date  ? PLT 248 07/31/2021  ? ? ?Diet :  ?Diet Order   ? ?       ?  Diet regular Room service appropriate? Yes with Assist; Fluid consistency: Thin  Diet effective now       ?  ? ?  ?  ? ?  ?  ? ?Inpatient Medications ? ?Scheduled Meds: ? chlorhexidine gluconate (MEDLINE KIT)  15 mL Mouth Rinse BID  ? Chlorhexidine Gluconate Cloth  6 each Topical Daily  ? enoxaparin (LOVENOX) injection  50 mg Subcutaneous C16S  ? folic acid  1 mg Oral Daily  ? insulin aspart  2-6 Units Subcutaneous Q4H  ? mouth rinse  15 mL Mouth Rinse 10 times per day  ? multivitamin with minerals  1 tablet Oral Daily  ? nystatin cream   Topical TID  ? potassium chloride  40 mEq Oral Once  ? sodium chloride flush  10-40 mL Intracatheter Q12H  ? thiamine  100 mg Oral Daily  ? ?Continuous Infusions: ? sodium chloride Stopped (07/28/21 1946)  ? magnesium sulfate bolus IVPB    ? potassium chloride    ? ?PRN Meds:.sodium chloride, acetaminophen, haloperidol lactate, zolpidem ? ?Antibiotics  :   ? ?Anti-infectives (From admission, onward)  ? ? Start     Dose/Rate Route Frequency Ordered Stop  ? 07/28/21 1930  vancomycin (VANCOCIN) IVPB 1000 mg/200 mL premix  Status:  Discontinued       ? 1,000 mg ?200 mL/hr over 60 Minutes Intravenous Every 12 hours 07/28/21 1835 07/29/21 0710  ? 07/28/21 1500  meropenem (MERREM) 1 g in sodium chloride 0.9 % 100 mL IVPB  Status:  Discontinued       ? 1 g ?200 mL/hr over 30 Minutes Intravenous Every 8 hours 07/28/21 1410 07/30/21 0919  ? ?  ? ? ? Time Spent in minutes   30 ? ? ?Lala Lund M.D on 07/31/2021 at 10:10 AM ? ?To page go to [email or web address]  ? ?[facility] -  Office  [phone] ? ?See all Orders from today for further details ? ? ? Objective:  ? ?Vitals:  ? 07/30/21 2302 07/31/21 0301 07/31/21 0500 07/31/21 0754  ?BP: 130/77 118/60  118/85  ?Pulse: (!) 102 88    ?Resp: 17 17  19   ?Temp: 98.4 ?F (36.9 ?C) 98 ?F (36.7 ?C)  98.3 ?F (36.8 ?C)  ?TempSrc: Oral Oral  Oral  ?SpO2: 98% (!) 89%    ?Weight:   101 kg   ?Height:      ? ? ?Wt Readings from Last 3 Encounters:  ?07/31/21 101 kg  ?10/30/17 99.8 kg  ? ? ? ?Intake/Output Summary (Last 24 hours) at 07/31/2021 1010 ?Last data filed at 07/30/2021 1700 ?Gross per 24 hour  ?Intake 120 ml  ?Output 1450 ml  ?Net -1330 ml  ? ? ? ?Physical Exam ? ?Awake Alert, No new F.N deficits, Normal affect ?[place].AT,PERRAL ?Supple Neck, No JVD,   ?Symmetrical Chest wall movement, Good air movement bilaterally, CTAB ?RRR,No Gallops,Rubs or new Murmurs,  ?+ve B.Sounds, Abd Soft, No tenderness,   ?No Cyanosis, Clubbing or edema  ?  ?  ? ? Data Review:  ? ? ?CBC ?Recent Labs  ?Lab 07/28/21 ?1434 07/28/21 ?2002 07/29/21 ?1155 07/31/21 ?0433  ?WBC  --  5.8 5.6 6.1  ?HGB 8.5* 9.6* 9.3* 10.1*  ?HCT 25.0* 29.0* 28.5* 30.3*  ?PLT  --  174 195 248  ?MCV  --  94.2 93.4 91.8  ?MCH  --  31.2 30.5 30.6  ?MCHC  --  33.1 32.6 33.3  ?RDW  --  14.7 14.4 13.9  ?LYMPHSABS  --   --   --  1.8  ?MONOABS  --   --   --  0.5  ?EOSABS  --   --   --  0.4  ?BASOSABS  --   --   --  0.0  ? ? ?Electrolytes ?Recent Labs  ?Lab 07/28/21 ?1434 07/28/21 ?1451 07/28/21 ?2004 07/29/21 ?0310 07/31/21 ?0433  ?NA 144 144 141 139 141  ?K 3.7 3.2* 4.4 4.1 3.2*  ?CL  --  118* 114* 110 107  ?CO2  --  20* 21* 22 28  ?GLUCOSE  --  94 131* 135* 102*  ?BUN  --  7 7 8  <5*  ?CREATININE  --  0.50 0.60 0.53 0.54  ?CALCIUM  --  7.4* 8.1* 8.4* 8.6*  ?AST  --   --   --  19  --   ?ALT  --   --   --  17  --   ?ALKPHOS  --   --   --  48  --   ?BILITOT  --   --   --  0.5  --   ?ALBUMIN  --   --   --   2.5*  --   ?MG  --  1.5*  --  2.1 1.8  ?PROCALCITON  --  <0.10  --   --   --   ? ? ? ?Micro Results ?Recent Results (from the past 240 hour(s))  ?MRSA  Next Gen by PCR, Nasal     Status: Abnormal  ? Collection Tim

## 2021-07-31 NOTE — TOC Progression Note (Signed)
Transition of Care (TOC) - Progression Note  ? ? ?Patient Details  ?Name: Angela Mathews ?MRN: [id number] ?Date of Birth: 14-Jul-1985 ? ?Transition of Care (TOC) CM/SW Contact  ?Lawerance Sabal, RN ?Phone Number: ?07/31/2021, 2:00 PM ? ?Clinical Narrative:   spoke to patient along with father and step mother at bedside. Confirm she plans [place] back to address on file with her mother [name].  ?She declines IR and wants to return home w OP PT that she will set up at [facility] by her house/  ?No anticipated DME needs.  ?Will follow for medication assistance at DC, as patient is uninsured.  ?Will add [facility] to AVS for primary care ? ? ? ?Expected Discharge Plan: Home/Self Care ?Barriers to Discharge: Continued Medical Work up ? ?Expected Discharge Plan and Services ?Expected Discharge Plan: Home/Self Care ?In-house Referral: Clinical Social Work ?Discharge Planning Services: CM Consult ?  ?Living arrangements for the past 2 months: Apartment ?                ?  ?  ?  ?  ?  ?  ?  ?  ?  ?  ? ? ?Social Determinants of Health (SDOH) Interventions ?  ? ?Readmission Risk Interventions ?   ? View : No data to display.  ?  ?  ?  ? ? ?

## 2021-07-31 NOTE — Progress Notes (Signed)
PICC maintenance: Pt has no IV meds ordered at this time. Please consider PICC removal. ?

## 2021-07-31 NOTE — Progress Notes (Signed)
Physical Therapy Treatment ?Patient Details ?Name: Angela Mathews ?MRN: [id number] ?DOB: 1985-11-24 ?Today's Date: 07/31/2021 ? ? ?History of Present Illness Pt adm to [facility] on 4/14 for drug overdose. Pt intubated on 4/14. Pt transferred to [facility] on 4/19 for further work up for possible basal ganglia stroke. Neurology concluded the lesions seen on MRI/CT are consistent with drug use/toxin exposure. Pt extubated 4/20. PMH - polysubstance abuse ? ?  ?PT Comments  ? ? Patient making significant progress towards physical therapy goals. Patient is impulsive and requires frequent cues for waiting for therapist due to line management. Patient required minA+2 for hallway ambulation for safety and balance as patient drifts L/R throughout. Patient will have mother to assist at discharge. Updated recommendation to OPPT to address balance and strength deficits.  ? ?*Encouraged patient and family to call for NT/RN to ambulate to/from bathroom or BSC and to avoid use of purewick as this will assist with improving mobility and balance, both verbalized understanding.  ?   ?Recommendations for follow up therapy are one component of a multi-disciplinary discharge planning process, led by the attending physician.  Recommendations may be updated based on patient status, additional functional criteria and insurance authorization. ? ?Follow Up Recommendations ? Outpatient PT ?  ?  ?Assistance Recommended at Discharge Frequent or constant Supervision/Assistance  ?Patient can return home with the following A little help with walking and/or transfers;A little help with bathing/dressing/bathroom;Assistance with cooking/housework;Direct supervision/assist for medications management;Direct supervision/assist for financial management;Assist for transportation;Help with stairs or ramp for entrance ?  ?Equipment Recommendations ? None recommended by PT  ?  ?Recommendations for Other Services   ? ? ?  ?Precautions / Restrictions  Precautions ?Precautions: Fall ?Precaution Comments: impulsive ?Restrictions ?Weight Bearing Restrictions: No  ?  ? ?Mobility ? Bed Mobility ?Overal bed mobility: Needs Assistance ?Bed Mobility: Supine to Sit ?  ?  ?Supine to sit: Min guard, HOB elevated ?  ?  ?General bed mobility comments: no physical assist, impulsive, no line awareness ?  ? ?Transfers ?Overall transfer level: Needs assistance ?Equipment used: 1 person hand held assist ?Transfers: Sit to/from Stand ?Sit to Stand: Min guard, +2 safety/equipment ?  ?  ?  ?  ?  ?General transfer comment: Pt able to power up without problem, however very impulsive with no line awareness. Cues for safety. frequently reaches out for external support from environment ?  ? ?Ambulation/Gait ?Ambulation/Gait assistance: Min assist, +2 safety/equipment ?Gait Distance (Feet): 120 Feet ?Assistive device: IV Pole, None ?Gait Pattern/deviations: Step-through pattern, Decreased stride length, Drifts right/left ?Gait velocity: decreased ?  ?  ?General Gait Details: minA+2 for balance. Patient using IV pole in hallway and reaching for objects in room for support but not with heavy reliance. Drifting L/R. Poor awareness to lines and obstacles ? ? ?Stairs ?  ?  ?  ?  ?  ? ? ?Wheelchair Mobility ?  ? ?Modified Rankin (Stroke Patients Only) ?  ? ? ?  ?Balance Overall balance assessment: Needs assistance ?Sitting-balance support: Feet supported ?Sitting balance-Leahy Scale: Good ?  ?  ?Standing balance support: Single extremity supported, No upper extremity supported, During functional activity ?Standing balance-Leahy Scale: Fair ?Standing balance comment: frequently reaches out for external support from environment for balance ?  ?  ?  ?  ?  ?  ?  ?  ?  ?  ?  ?  ? ?  ?Cognition Arousal/Alertness: Awake/alert ?Behavior During Therapy: Impulsive ?Overall Cognitive Status: Impaired/Different from baseline ?Area of Impairment:  Attention, Safety/judgement, Awareness, Problem solving ?  ?   ?  ?  ?  ?  ?  ?  ?  ?Current Attention Level: Selective ?  ?Following Commands: Follows one step commands consistently ?Safety/Judgement: Decreased awareness of safety, Decreased awareness of deficits ?Awareness: Emergent ?Problem Solving: Requires verbal cues, Requires tactile cues ?General Comments: very impulsive with no line awareness, decreased balance with no safety awareness ?  ?  ? ?  ?Exercises   ? ?  ?General Comments General comments (skin integrity, edema, etc.): father and step-mother in room throughout session, on RA throughout session. Encouraged family and patient to call RN/NT for bathroom needs rather than using purewick ?  ?  ? ?Pertinent Vitals/Pain Pain Assessment ?Pain Assessment: No/denies pain  ? ? ?Home Living Family/patient expects to be discharged to:: Private residence ?Living Arrangements: Parent ?Available Help at Discharge: Family;Available 24 hours/day (80% of the time) ?Type of Home: House ?Home Access: Level entry ?  ?  ?  ?Home Layout: One level ?Home Equipment: None ?   ?  ?Prior Function    ?  ?  ?   ? ?PT Goals (current goals can now be found in the care plan section) Acute Rehab PT Goals ?PT Goal Formulation: With patient/family ?Time For Goal Achievement: 08/13/21 ?Potential to Achieve Goals: Good ?Progress towards PT goals: Progressing toward goals ? ?  ?Frequency ? ? ? Min 3X/week ? ? ? ?  ?PT Plan Discharge plan needs to be updated  ? ? ?Co-evaluation PT/OT/SLP Co-Evaluation/Treatment: Yes ?Reason for Co-Treatment: Necessary to address cognition/behavior during functional activity;For patient/therapist safety;To address functional/ADL transfers ?PT goals addressed during session: Mobility/safety with mobility;Balance ?OT goals addressed during session: ADL's and self-care;Strengthening/ROM ?  ? ?  ?AM-PAC PT "6 Clicks" Mobility   ?Outcome Measure ? Help needed turning from your back to your side while in a flat bed without using bedrails?: A Little ?Help needed moving  from lying on your back to sitting on the side of a flat bed without using bedrails?: A Little ?Help needed moving to and from a bed to a chair (including a wheelchair)?: A Little ?Help needed standing up from a chair using your arms (e.g., wheelchair or bedside chair)?: A Little ?Help needed to walk in hospital room?: A Little ?Help needed climbing 3-5 steps with a railing? : A Lot ?6 Click Score: 17 ? ?  ?End of Session Equipment Utilized During Treatment: Gait belt ?Activity Tolerance: Patient tolerated treatment well ?Patient left: in bed;with call bell/phone within reach;with bed alarm set;with family/visitor present ?Nurse Communication: Mobility status ?PT Visit Diagnosis: Unsteadiness on feet (R26.81);Other abnormalities of gait and mobility (R26.89);Muscle weakness (generalized) (M62.81) ?  ? ? ?Time: 1203-1226 ?PT Time Calculation (min) (ACUTE ONLY): 23 min ? ?Charges:  $Gait Training: 8-22 mins          ?          ? ?Bryan Goin A. Gilford Rile, PT, DPT ?Acute Rehabilitation Services ?Pager [phone] ?Office [phone] ? ? ? ?Clarabell Matsuoka A Rayder Sullenger ?07/31/2021, 1:53 PM ? ?

## 2021-07-31 NOTE — Progress Notes (Signed)
Inpatient Rehab Admissions: ? ?Inpatient Rehab Consult received.  I met with patient at the bedside for rehabilitation assessment and to discuss goals and expectations of an inpatient rehab admission.  Pt acknowledged understanding of CIR goals and expectations. Pt would prefer to go home and receive therapy at an outpatient facility or receive [facility] therapy. TOC made aware.  ? ?Signed: ?Gayland Curry, MS, CCC-SLP ?Admissions Coordinator ?[phone] ? ? ?

## 2021-07-31 NOTE — Progress Notes (Signed)
Pt educated about the importance of using cal l bell and accepting help when ambulating to restroom and in room. Pt states that she can ambulate without assistance, even though she has unsteady gait. Bell alarms remain in place and charge RN notified. ?

## 2021-07-31 NOTE — Evaluation (Addendum)
Occupational Therapy Evaluation ?Patient Details ?Name: Angela Mathews ?MRN: [id number] ?DOB: 12-10-1985 ?Today's Date: 07/31/2021 ? ? ?History of Present Illness Pt adm to [facility] on 4/14 for drug overdose. Pt intubated on 4/14. Pt transferred to [facility] on 4/19 for further work up for possible basal ganglia stroke. Neurology concluded the lesions seen on MRI/CT are consistent with drug use/toxin exposure. Pt extubated 4/20. PMH - polysubstance abuse  ? ?Clinical Impression ?  ?Pt is typically independent. Works in Psychologist, educational, drives. Today she is overall min guard for transfers, in room mobility to bathroom, toileting including peri care, LB dressing (min A), sink level grooming. Pt with balance deficits and frequently reaching out for environmental support, or wants to push IV during hallway mobility. [name] biggest obstacle is her decreased safety awareness. She has no line awareness, and is very impulsive. OT will follow acutely, but do not anticipate the need for post-acute OT at this time. ? ?OF NOTE: At start of session, Pt found on purewick. Pt reports no incontinence, and has demonstrated ability to mobilize functional distance to use bathroom or at minimum [facility]. Pt should not be placed on purewick any longer to facilitate increased mobility and ambulation to bathroom.  Pt has orders to ambulate in room, as well as up to chair with RN/NT staff. Pt is ambulatory at min guard assist level of 1 staff person without DME. Pt is set to dc home with limited access to [facility] services, any mobility that RN/NT staff can assist Pt with will improve activity tolerance and safety in preparation for home. ?   ? ?Recommendations for follow up therapy are one component of a multi-disciplinary discharge planning process, led by the attending physician.  Recommendations may be updated based on patient status, additional functional criteria and insurance authorization.  ? ?Follow Up Recommendations ? No OT follow up  ?   ?Assistance Recommended at Discharge Frequent or constant Supervision/Assistance  ?Patient can return home with the following A little help with walking and/or transfers;A little help with bathing/dressing/bathroom;Direct supervision/assist for medications management;Direct supervision/assist for financial management;Assist for transportation;Help with stairs or ramp for entrance ? ?  ?Functional Status Assessment ? Patient has had a recent decline in their functional status and demonstrates the ability to make significant improvements in function in a reasonable and predictable amount of time.  ?Equipment Recommendations ? None recommended by OT  ?  ?Recommendations for Other Services PT consult ? ? ?  ?Precautions / Restrictions Precautions ?Precautions: Fall ?Precaution Comments: impulsive ?Restrictions ?Weight Bearing Restrictions: No  ? ?  ? ?Mobility Bed Mobility ?Overal bed mobility: Needs Assistance ?Bed Mobility: Supine to Sit ?  ?  ?Supine to sit: Min guard, HOB elevated ?  ?  ?General bed mobility comments: no physical assist, impulsive, no line awareness ?  ? ?Transfers ?Overall transfer level: Needs assistance ?Equipment used: 1 person hand held assist ?Transfers: Sit to/from Stand, Bed to chair/wheelchair/BSC ?Sit to Stand: Min guard, +2 safety/equipment ?  ?  ?Step pivot transfers: Min assist, +2 safety/equipment ?  ?  ?General transfer comment: Pt able to power up without problem, however very impulsive with no line awareness. Cues for safety. frequently reaches out for external support from environment ?  ? ?  ?Balance Overall balance assessment: Needs assistance ?Sitting-balance support: Feet supported ?Sitting balance-Leahy Scale: Good ?  ?  ?Standing balance support: Single extremity supported, No upper extremity supported, During functional activity ?Standing balance-Leahy Scale: Fair ?Standing balance comment: frequently reaches out for external  support from environment for balance ?  ?  ?  ?   ?  ?  ?  ?  ?  ?  ?  ?   ? ?ADL either performed or assessed with clinical judgement  ? ?ADL Overall ADL's : Needs assistance/impaired ?Eating/Feeding: Modified independent ?  ?Grooming: Wash/dry hands;Wash/dry face;Oral care;Min guard;Standing ?Grooming Details (indicate cue type and reason): sink level, able to perform BUE tasks without difficulty, fine motor slight discoordination - but funtional ?Upper Body Bathing: Supervision/ safety ?  ?Lower Body Bathing: Supervison/ safety ?  ?Upper Body Dressing : Minimal assistance ?Upper Body Dressing Details (indicate cue type and reason): due to lines only ?Lower Body Dressing: Minimal assistance;Sit to/from stand;+2 for safety/equipment ?Lower Body Dressing Details (indicate cue type and reason): doffing underwear, pt standing on one leg, very impulsive and usafe ?Toilet Transfer: Minimal assistance;+2 for safety/equipment;Ambulation ?Toilet Transfer Details (indicate cue type and reason): pushes IV pole, frequently reaches for enviromental support ?Toileting- Water quality scientist and Hygiene: Min guard;Sit to/from stand ?Toileting - Clothing Manipulation Details (indicate cue type and reason): front and back peri care ?  ?  ?Functional mobility during ADLs: +2 for safety/equipment;Min guard (IMPULSIVE, moves quickly without warning) ?General ADL Comments: Pt very impulsive without safety awarness  ? ? ? ?Vision   ?Vision Assessment?: No apparent visual deficits  ?   ?Perception   ?  ?Praxis   ?  ? ?Pertinent Vitals/Pain Pain Assessment ?Pain Assessment: No/denies pain ?Pain Score: 0-No pain ?Pain Intervention(s): Monitored during session, Repositioned  ? ? ? ?Hand Dominance Right ?  ?Extremity/Trunk Assessment Upper Extremity Assessment ?Upper Extremity Assessment: Generalized weakness (overall decreased fine motor, but functional - coordination more difficult) ?  ?Lower Extremity Assessment ?Lower Extremity Assessment: Defer to PT evaluation ?  ?  ?   ?Communication Communication ?Communication: No difficulties ?  ?Cognition Arousal/Alertness: Awake/alert ?Behavior During Therapy: Impulsive ?Overall Cognitive Status: Impaired/Different from baseline ?Area of Impairment: Attention, Safety/judgement, Awareness, Problem solving ?  ?  ?  ?  ?  ?  ?  ?  ?  ?Current Attention Level: Selective ?  ?Following Commands: Follows one step commands consistently ?Safety/Judgement: Decreased awareness of safety, Decreased awareness of deficits ?Awareness: Emergent ?Problem Solving: Requires verbal cues, Requires tactile cues ?General Comments: very impulsive with no line awareness, decreased balance with no safety awareness ?  ?  ?General Comments  father and step-mother in room throughout session, on RA throughout session ? ?  ?Exercises   ?  ?Shoulder Instructions    ? ? ?Home Living Family/patient expects to be discharged to:: Private residence ?Living Arrangements: Parent ?Available Help at Discharge: Family;Available 24 hours/day (80% of the time) ?Type of Home: House ?Home Access: Level entry ?  ?  ?Home Layout: One level ?  ?  ?Bathroom Shower/Tub: Tub/shower unit ?  ?Bathroom Toilet: Standard ?  ?  ?Home Equipment: None ?  ?  ?  ? ?  ?Prior Functioning/Environment Prior Level of Function : Independent/Modified Independent;Driving ?  ?  ?  ?  ?  ?  ?  ?ADLs Comments: works in Psychologist, educational for [employer], factory floor ?  ? ?  ?  ?OT Problem List: Impaired balance (sitting and/or standing);Decreased safety awareness;Obesity ?  ?   ?OT Treatment/Interventions: Self-care/ADL training;Therapeutic activities;Cognitive remediation/compensation;Patient/family education;Balance training  ?  ?OT Goals(Current goals can be found in the care plan section) Acute Rehab OT Goals ?Patient Stated Goal: get home for bday ?OT Goal Formulation: With patient ?Time For Goal  Achievement: 08/14/21 ?Potential to Achieve Goals: Good ?ADL Goals ?Pt Will Perform Grooming: with modified  independence;standing ?Pt Will Perform Upper Body Dressing: with modified independence;standing ?Pt Will Perform Lower Body Dressing: with modified independence;sit to/from stand ?Pt Will Transfer to Toilet: with modified ind

## 2021-07-31 NOTE — Progress Notes (Addendum)
Notified by telemetry pt had 10-beat run of V-tach, strip available , VSS, 95%, 75,109/62, 14, pt in R side lying position, denies chest pain, palpations, or dizziness, no signs of distress, 2nd run of V-tach this shift, 6 beat run at 2300 pt also asymptomatic at that time. Will continue to observe and monitor closely. Will notify MD  ?

## 2021-08-01 DIAGNOSIS — T50904D Poisoning by unspecified drugs, medicaments and biological substances, undetermined, subsequent encounter: Secondary | ICD-10-CM

## 2021-08-01 DIAGNOSIS — J690 Pneumonitis due to inhalation of food and vomit: Secondary | ICD-10-CM

## 2021-08-01 LAB — CBC WITH DIFFERENTIAL/PLATELET
Abs Immature Granulocytes: 0.05 10*3/uL (ref 0.00–0.07)
Basophils Absolute: 0 10*3/uL (ref 0.0–0.1)
Basophils Relative: 0 %
Eosinophils Absolute: 0.4 10*3/uL (ref 0.0–0.5)
Eosinophils Relative: 4 %
HCT: 33.8 % — ABNORMAL LOW (ref 36.0–46.0)
Hemoglobin: 10.7 g/dL — ABNORMAL LOW (ref 12.0–15.0)
Immature Granulocytes: 1 %
Lymphocytes Relative: 17 %
Lymphs Abs: 1.9 10*3/uL (ref 0.7–4.0)
MCH: 29.8 pg (ref 26.0–34.0)
MCHC: 31.7 g/dL (ref 30.0–36.0)
MCV: 94.2 fL (ref 80.0–100.0)
Monocytes Absolute: 0.6 10*3/uL (ref 0.1–1.0)
Monocytes Relative: 6 %
Neutro Abs: 8 10*3/uL — ABNORMAL HIGH (ref 1.7–7.7)
Neutrophils Relative %: 72 %
Platelets: 327 10*3/uL (ref 150–400)
RBC: 3.59 MIL/uL — ABNORMAL LOW (ref 3.87–5.11)
RDW: 14 % (ref 11.5–15.5)
WBC: 10.9 10*3/uL — ABNORMAL HIGH (ref 4.0–10.5)
nRBC: 0 % (ref 0.0–0.2)

## 2021-08-01 LAB — COMPREHENSIVE METABOLIC PANEL
ALT: 30 U/L (ref 0–44)
AST: 33 U/L (ref 15–41)
Albumin: 2.9 g/dL — ABNORMAL LOW (ref 3.5–5.0)
Alkaline Phosphatase: 54 U/L (ref 38–126)
Anion gap: 6 (ref 5–15)
BUN: <5 mg/dL — ABNORMAL LOW (ref 6–20)
CO2: 25 mmol/L (ref 22–32)
Calcium: 8.9 mg/dL (ref 8.9–10.3)
Chloride: 106 mmol/L (ref 98–111)
Creatinine, Ser: 0.53 mg/dL (ref 0.44–1.00)
GFR, Estimated: >60 mL/min (ref >60)
Glucose, Bld: 121 mg/dL — ABNORMAL HIGH (ref 70–99)
Potassium: 3.6 mmol/L (ref 3.5–5.1)
Sodium: 137 mmol/L (ref 135–145)
Total Bilirubin: 0.4 mg/dL (ref 0.3–1.2)
Total Protein: 6.5 g/dL (ref 6.5–8.1)

## 2021-08-01 LAB — MAGNESIUM: Magnesium: 2.1 mg/dL (ref 1.7–2.4)

## 2021-08-01 LAB — GLUCOSE, CAPILLARY: Glucose-Capillary: 116 mg/dL — ABNORMAL HIGH (ref 70–99)

## 2021-08-01 LAB — BRAIN NATRIURETIC PEPTIDE: B Natriuretic Peptide: 53 pg/mL (ref 0.0–100.0)

## 2021-08-01 MED ORDER — POTASSIUM CHLORIDE 20 MEQ PO PACK
40.0000 meq | PACK | ORAL | Status: DC
  Administered 2021-08-01: 40 meq via ORAL
  Filled 2021-08-01: qty 2

## 2021-08-01 NOTE — Progress Notes (Signed)
[facility] night cross cover note: ? ?I was notified by RN of existing orders for cbg checks on qac/hs basis, with q4H SSI coverage. I subsequently changed SS coverage to correlate w/ the above accuchecks, while refraining from qhs coverage.  ? ? ? ?Update: 9 beat run of nonsustained V. Tach, without any associated acute symptoms. Otherwise, VSS.  ? ? ? ?Newton Pigg, DO ?Hospitalist ? ?

## 2021-08-01 NOTE — Discharge Summary (Signed)
?                                                                                ? ?Angela Mathews [id number] DOB: 1985-08-13 DOA: 07/28/2021 ? ?PCP: Patient, No Pcp Per (Inactive) ? ?Admit date: 07/28/2021  Discharge date: 08/01/2021 ? ?Admitted From: Home   Disposition:  Home ? ? ?Recommendations for Outpatient Follow-up:  ? ?Follow up with PCP in 1-2 weeks ? ?PCP Please obtain BMP/CBC, 2 view CXR in 1week,  (see Discharge instructions)  ? ?PCP Please follow up on the following pending results: Needs outpt Echo ? ? ?Home Health: PT, OT if qualifies   ?Equipment/Devices: as below  ?Consultations: Neuro, PCCM ?Discharge Condition: Stable    ?CODE STATUS: Full    ?Diet Recommendation: Heart Healthy  ? ?CC - AMS ?  ? ?Brief history of present illness from the day of admission and additional interim summary   ? ? 36 yo F w/ pertinent PMH of polysubstance abuse presents to [facility] ED on 4/14 with AMS. EMS found patient on 4/14 with drugs and needles lying next to her. Pupils pinpoint. She was bagged and transported to [facility] ED on 4/14. There patient was given another dose of IV Narcan with some response to painful stimuli. Patient was intubated for airway protection. UDS positive for amphetamines, opiates, and benzos. CXR showing multilobar bilateral bronchopneumoni concerning for aspiration pneumonia.  She was intubated and admitted to initially [facility] subsequently transferred to [facility] on 07/28/2020 for further care from [facility].  She was admitted to ICU at [facility] stabilized and transferred to hospitalist service on 07/31/2021 under my care.  At [facility] her work-up was consistent with some neurological injury as noted on MRI consistent with toxic injury from drug overdose, she was seen by neurology stroke was ruled out ? ?                                                                Hospital Course  ? ?Acute toxic encephalopathy with B/l basal ganglia hypodensity, due to recreational drug overdose: she seen by neurology stroke has been clinically ruled out, MRI brain and CT C-spine and EEG noted.  Per neurology these changes are all consistent with drug overdose-related neurological injury, she was seen by PT OT and got supportive care now back to baseline.  Does not want any referrals to the addiction centers, does not want any further work-up, now wants to go home and states that she is feeling as good as she does on a normal day, extensively counseled not to use any recreational drugs or abuse prescription medications.  Last recreational drug use on 07/23/2021.  She is at her baseline will be discharged home. ?  ?Acute respiratory failure due to above and inability to protect airway.  Initially intubated now extubated and at baseline on room air. ?  ?Sepsis due to aspiration pneumonia.  1 out of 2  blood cultures contaminated with staph epidermis.  Has finished antibiotic treatment in ICU.  Sepsis pathophysiology has completely resolved, cleared by speech. ?  ?Macupapular rash: Fungal rash diagnosed in ICU, resolved with nystatin cream.  ?  ?Morbid obesity, BMI more than 40.  Follow with PCP for weight loss. ?  ?Anemia normocytic.   No need for transfusion.  Outpatient age-appropriate work-up by PCP. ? ? ?Discharge diagnosis   ? ? ?Principal Problem: ?  Drug overdose ?Active Problems: ?  Goals of care, counseling/discussion ?  Overdose of undetermined intent ?  Acute encephalopathy ?  Acute respiratory failure ([place]) ?  Aspiration pneumonia ([place]) ? ? ? ?Discharge instructions   ? ?Discharge Instructions   ? ? Diet - low sodium heart healthy   Complete by: As directed ?  ? Discharge instructions   Complete by: As directed ?  ? Follow with Primary MD  in 7 days  ? ?Get CBC, CMP, magnesium, echocardiogram of the heart-  checked next visit within 1 week by Primary MD   ? ?Activity: As  tolerated with Full fall precautions use walker/cane & assistance as needed ? ?Disposition Home   ? ?Diet: Heart Healthy   ? ?Special Instructions: If you have smoked or chewed Tobacco  in the last 2 yrs please stop smoking, stop any regular Alcohol  and or any Recreational drug use. ? ?On your next visit with your primary care physician please Get Medicines reviewed and adjusted. ? ?Please request your Prim.MD to go over all Hospital Tests and Procedure/Radiological results at the follow up, please get all Hospital records sent to your Prim MD by signing hospital release before you go home. ? ?If you experience worsening of your admission symptoms, develop shortness of breath, life threatening emergency, suicidal or homicidal thoughts you must seek medical attention immediately by calling 911 or calling your MD immediately  if symptoms less severe. ? ?You Must read complete instructions/literature along with all the possible adverse reactions/side effects for all the Medicines you take and that have been prescribed to you. Take any new Medicines after you have completely understood and accpet all the possible adverse reactions/side effects.  ? ?Do not drive, operate heavy machinery, perform activities at heights, swimming or participation in water activities or provide baby sitting services if your were admitted for syncope or siezures until you have seen by Primary MD or a Neurologist and advised to do so again. ? ?Do not drive when taking Pain medications.  Do not take more than prescribed Pain, Sleep and Anxiety Medications ? ?Wear Seat belts while driving.  ? Increase activity slowly   Complete by: As directed ?  ? ?  ? ? ?Discharge Medications  ? ?Allergies as of 08/01/2021   ? ?   Reactions  ? Augmentin [amoxicillin-pot Clavulanate] Anaphylaxis  ? ?  ? ?  ?Medication List  ?  ? ?STOP taking these medications   ? ?clindamycin 300 MG capsule ?Commonly known as: CLEOCIN ?  ?HYDROcodone-acetaminophen 5-325 MG  tablet ?Commonly known as: Norco ?  ?naproxen 375 MG tablet ?Commonly known as: NAPROSYN ?  ?ondansetron 4 MG disintegrating tablet ?Commonly known as: Zofran ODT ?  ?potassium chloride SA 20 MEQ tablet ?Commonly known as: KLOR-CON M ?  ? ?  ? ?TAKE these medications   ? ?acetaminophen 500 MG tablet ?Commonly known as: TYLENOL ?Take 500 mg by mouth every 6 (six) hours as needed for mild pain. ?  ?ibuprofen 200 MG tablet ?Commonly known as:  ADVIL ?Take 200 mg by mouth every 6 (six) hours as needed for headache. ?  ? ?  ? ?  ?  ? ? ?  ?Durable Medical Equipment  ?(From admission, onward)  ?  ? ? ?  ? ?  Start     Ordered  ? 07/31/21 0959  For home use only DME Walker rolling  Once       ?Comments: 5 wheel  ?Question Answer Comment  ?Walker: With 5 Inch Wheels   ?Patient needs a walker to treat with the following condition Weakness   ?  ? 07/31/21 0959  ? ?  ?  ? ?  ? ? ? Follow-up Information   ? ? [place]. Call.   ?Why: For primary care, please call to schedule a follow up ?Contact information: ?[place] ?[place] [phone] ?[phone] ? ?  ?  ? ? [facility]. Schedule an appointment as soon as possible for a visit in 1 week(s).   ?Specialty: Neurology ?Why: Toxic encephalopathy ?Contact information: ?[address] [place] ?[phone] ? ?  ?  ? ?  ?  ? ?  ? ? ?Major procedures and Radiology Reports - PLEASE review detailed and final reports thoroughly  -    ? ?  ?CT ABDOMEN PELVIS WO CONTRAST ? ?Result Date: 07/29/2021 ?CLINICAL DATA:  Bowel obstruction suspected.  Probable aspiration. EXAM: CT ABDOMEN AND PELVIS WITHOUT CONTRAST TECHNIQUE: Multidetector CT imaging of the abdomen and pelvis was performed following the standard protocol without IV contrast. RADIATION DOSE REDUCTION: This exam was performed according to the departmental dose-optimization program which includes automated  exposure control, adjustment of the mA and/or kV according to patient size and/or use of iterative reconstruction technique. COMPARISON:  Chest CT no contrast 07/23/2021, CT abdomen and pelvis no contrast 0

## 2021-08-01 NOTE — Discharge Instructions (Signed)
Follow with Primary MD  in 7 days  ? ?Get CBC, CMP, magnesium, echocardiogram of the heart-  checked next visit within 1 week by Primary MD   ? ?Activity: As tolerated with Full fall precautions use walker/cane & assistance as needed ? ?Disposition Home   ? ?Diet: Heart Healthy   ? ?Special Instructions: If you have smoked or chewed Tobacco  in the last 2 yrs please stop smoking, stop any regular Alcohol  and or any Recreational drug use. ? ?On your next visit with your primary care physician please Get Medicines reviewed and adjusted. ? ?Please request your Prim.MD to go over all Hospital Tests and Procedure/Radiological results at the follow up, please get all Hospital records sent to your Prim MD by signing hospital release before you go home. ? ?If you experience worsening of your admission symptoms, develop shortness of breath, life threatening emergency, suicidal or homicidal thoughts you must seek medical attention immediately by calling 911 or calling your MD immediately  if symptoms less severe. ? ?You Must read complete instructions/literature along with all the possible adverse reactions/side effects for all the Medicines you take and that have been prescribed to you. Take any new Medicines after you have completely understood and accpet all the possible adverse reactions/side effects.  ? ?Do not drive, operate heavy machinery, perform activities at heights, swimming or participation in water activities or provide baby sitting services if your were admitted for syncope or siezures until you have seen by Primary MD or a Neurologist and advised to do so again. ? ?Do not drive when taking Pain medications.  Do not take more than prescribed Pain, Sleep and Anxiety Medications ? ?Wear Seat belts while driving. ? ?  ?

## 2021-08-01 NOTE — Progress Notes (Signed)
Order placed for IV team to remove pt's PICC line before she can be discharged.  ?

## 2021-08-01 NOTE — Progress Notes (Signed)
[name] mother has arrived with pt's clothes to go home in. Discharge paperwork reviewed with pt. Pt alert and oriented x4 in no acute distress upon discharge. Pt taken down to the main entrance via wheelchair by NT. [name] mother to transport pt home via private vehicle. Pt has taken all of her belongings with her.  ?

## 2022-03-06 DIAGNOSIS — Z20822 Contact with and (suspected) exposure to covid-19: Secondary | ICD-10-CM | POA: Diagnosis not present

## 2022-03-06 DIAGNOSIS — J029 Acute pharyngitis, unspecified: Secondary | ICD-10-CM | POA: Diagnosis not present

## 2022-03-06 DIAGNOSIS — Z87891 Personal history of nicotine dependence: Secondary | ICD-10-CM | POA: Diagnosis not present

## 2023-12-27 IMAGING — CT CT CERVICAL SPINE W/O CM
3 of 4 series · 13 of 33 positions shown, 16 images · non-contrast
Comparison: None available.

CLINICAL DATA: Concern for cervical compression fracture, upper
extremity weakness



[Series 4: c_spine 2.0 st · axial · 0.39mm/px · z∈[-192,-68]mm · 5 of 94 slices shown, 7 images]
[im 16/94  soft-tissue]
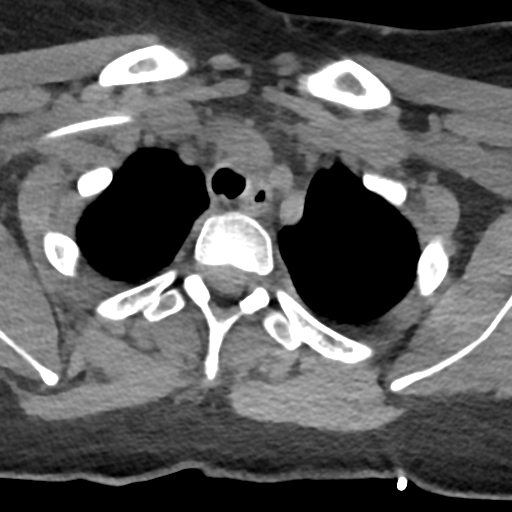
[im 16/94  bone]
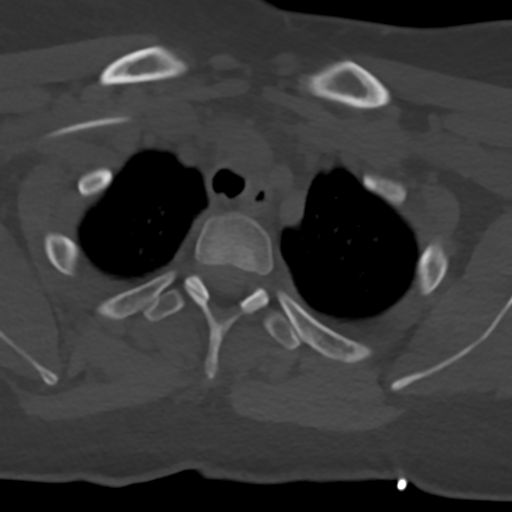
[im 32/94  bone]
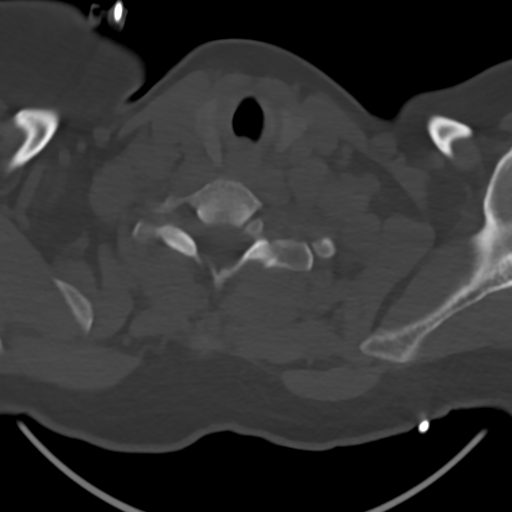
[im 47/94  bone]
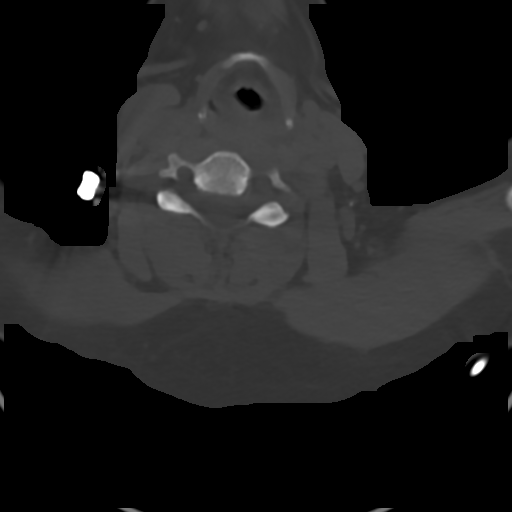
[im 63/94  bone]
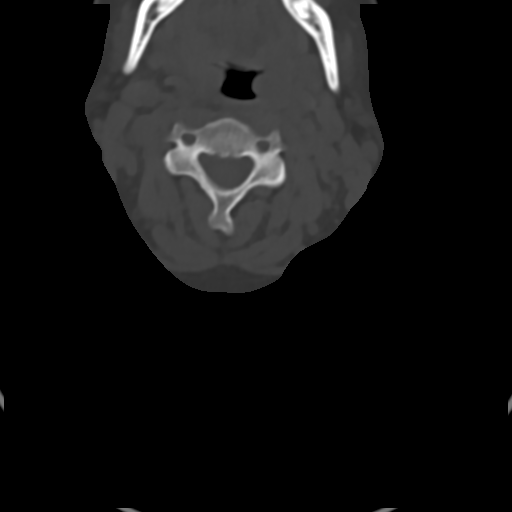
[im 78/94  soft-tissue]
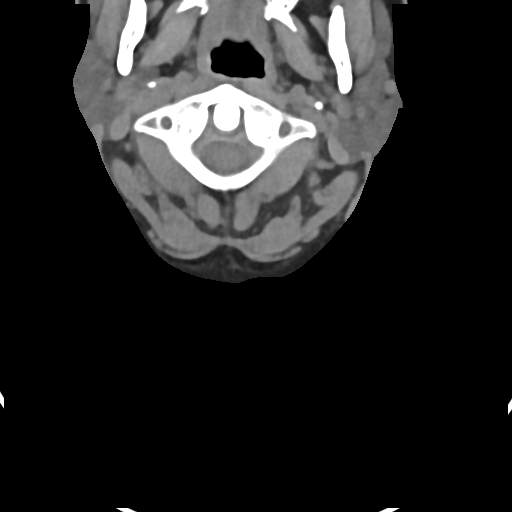
[im 78/94  bone]
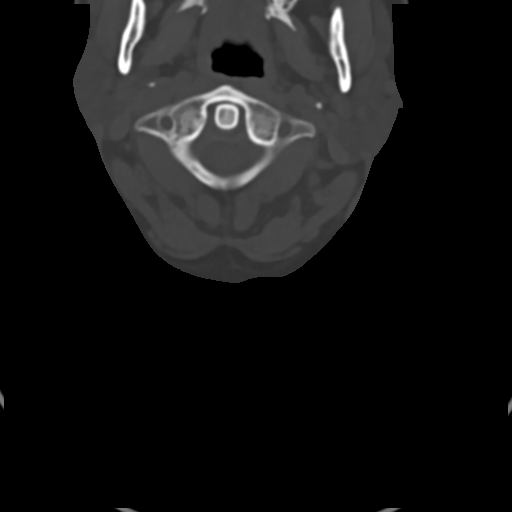

[Series 6: c_spine 2.0 sag bone · sagittal · 0.29mm/px · 5 of 65 slices shown, 6 images]
[im 22/65  bone]
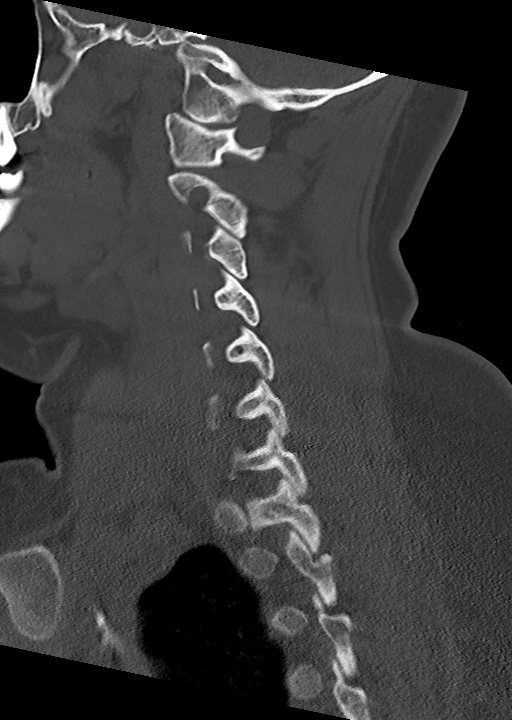
[im 27/65  bone]
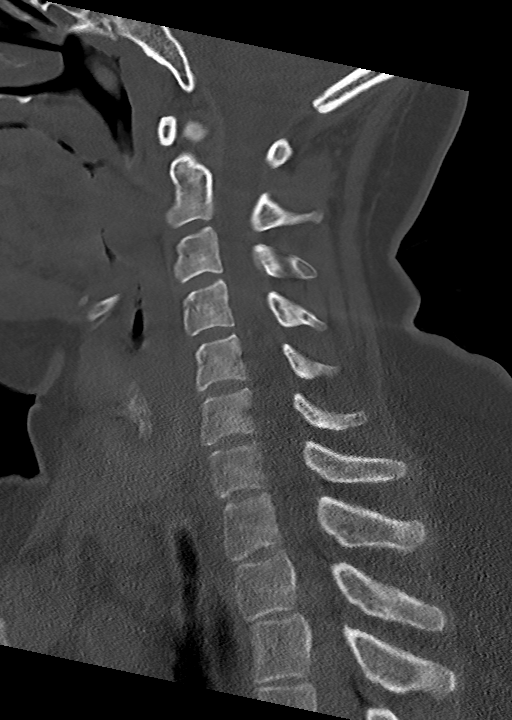
[im 33/65  soft-tissue]
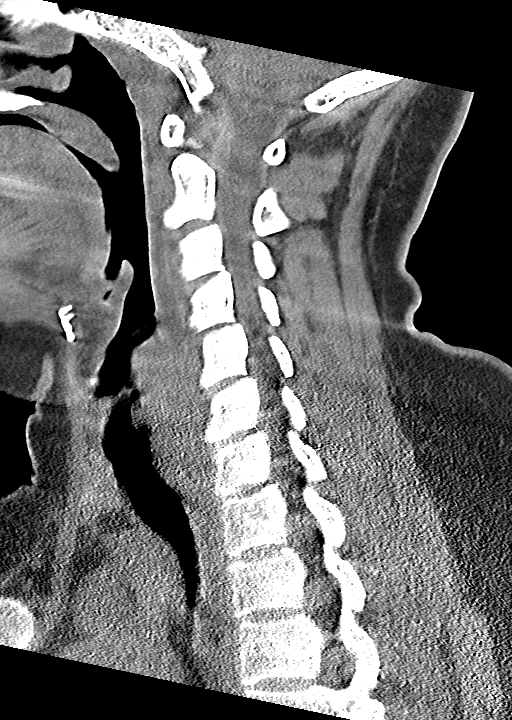
[im 33/65  bone]
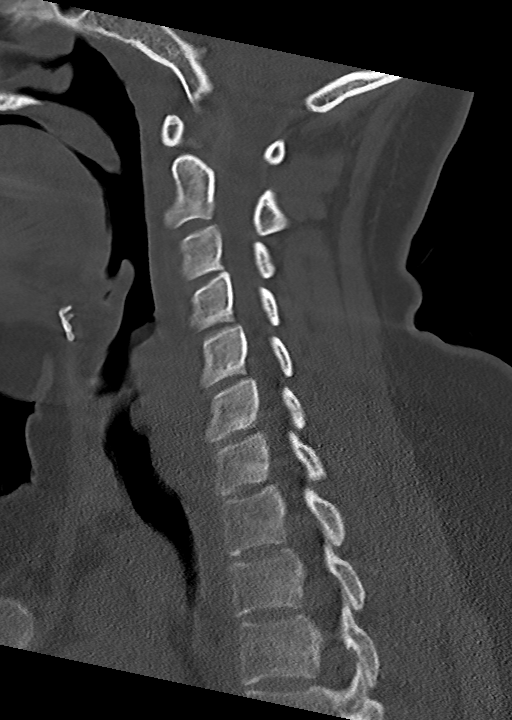
[im 38/65  bone]
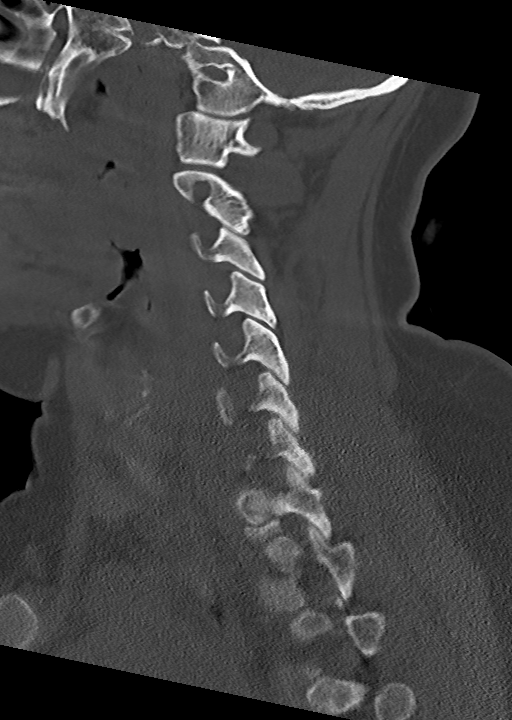
[im 43/65  bone]
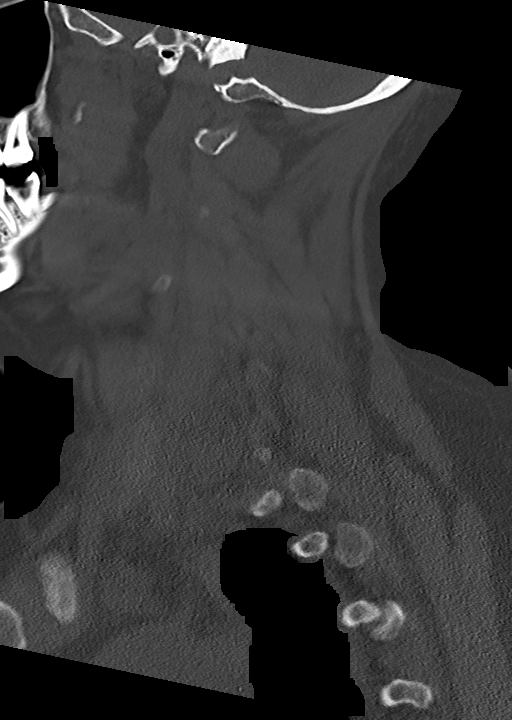

[Series 7: c_spine 2.0 cor bone · coronal · 0.30mm/px · 3 of 65 slices shown]
[im 13/65  bone]
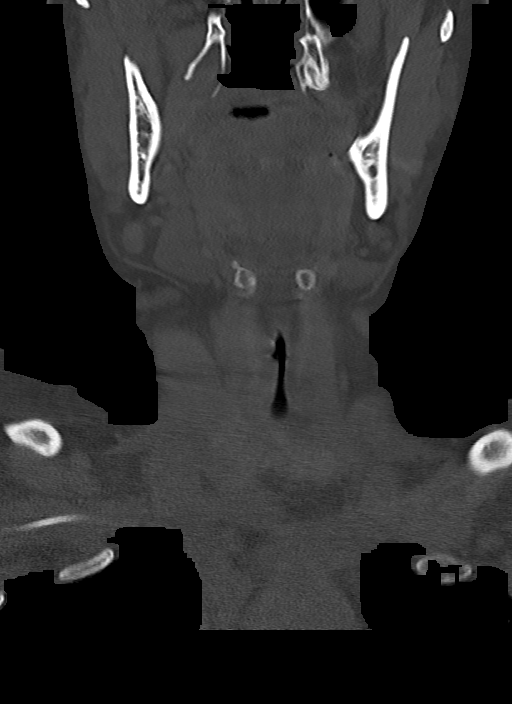
[im 26/65  bone]
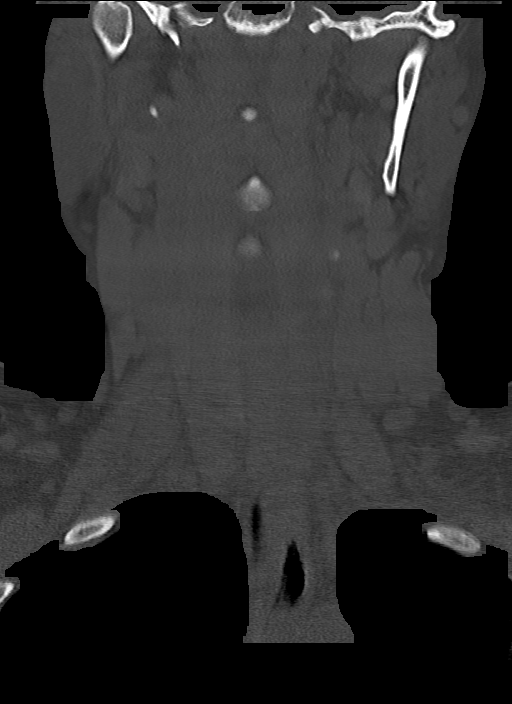
[im 39/65  bone]
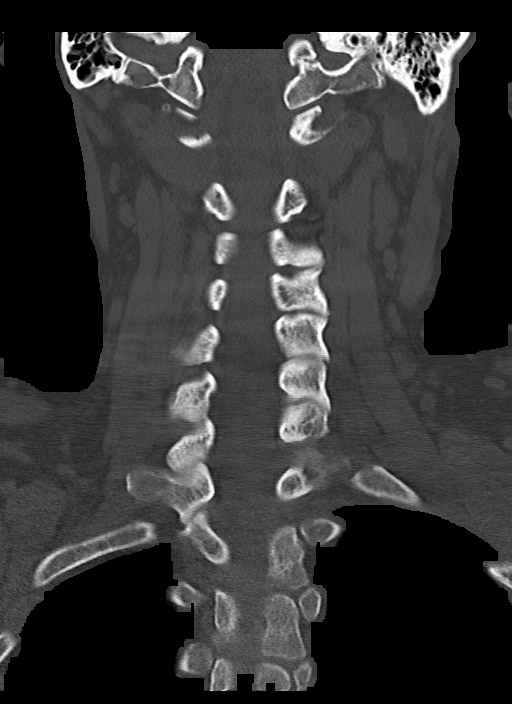

[13 of 33 positions shown; findings below may reference images not displayed]

FINDINGS: Alignment: Straightening of the normal cervical lordosis, which may
be positional.

Skull base and vertebrae: No acute fracture or suspicious osseous
lesion.

Soft tissues and spinal canal: No prevertebral fluid or swelling. No
visible canal hematoma.

Disc levels: Disc heights are largely preserved. No high-grade
spinal canal stenosis or neural foraminal narrowing.

Upper chest: Evaluation is limited by motion. No focal pulmonary
opacity or pleural effusion.

Other: None.
IMPRESSION: No acute fracture, listhesis, or significant degenerative changes in
the cervical spine.
